# Patient Record
Sex: Female | Born: 1960 | Race: White | Hispanic: No | Marital: Married | Smoking: Never smoker
Health system: Southern US, Community
[De-identification: ages and names within clinical notes are randomized; demographics above are authoritative.]

## PROBLEM LIST (undated history)

## (undated) DIAGNOSIS — C801 Malignant (primary) neoplasm, unspecified: Secondary | ICD-10-CM

## (undated) DIAGNOSIS — Z923 Personal history of irradiation: Secondary | ICD-10-CM

## (undated) DIAGNOSIS — F32A Depression, unspecified: Secondary | ICD-10-CM

## (undated) DIAGNOSIS — E039 Hypothyroidism, unspecified: Secondary | ICD-10-CM

## (undated) DIAGNOSIS — F419 Anxiety disorder, unspecified: Secondary | ICD-10-CM

## (undated) DIAGNOSIS — T4145XA Adverse effect of unspecified anesthetic, initial encounter: Secondary | ICD-10-CM

## (undated) DIAGNOSIS — R112 Nausea with vomiting, unspecified: Secondary | ICD-10-CM

## (undated) DIAGNOSIS — T8859XA Other complications of anesthesia, initial encounter: Secondary | ICD-10-CM

## (undated) DIAGNOSIS — Z9889 Other specified postprocedural states: Secondary | ICD-10-CM

## (undated) DIAGNOSIS — F329 Major depressive disorder, single episode, unspecified: Secondary | ICD-10-CM

## (undated) HISTORY — PX: BACK SURGERY: SHX140

## (undated) HISTORY — DX: Hypothyroidism, unspecified: E03.9

## (undated) HISTORY — PX: CARPAL TUNNEL RELEASE: SHX101

## (undated) HISTORY — PX: ELBOW SURGERY: SHX618

## (undated) SURGERY — Surgical Case
Anesthesia: *Unknown

---
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
## 2004-08-12 ENCOUNTER — Ambulatory Visit: Payer: Self-pay | Admitting: Unknown Physician Specialty

## 2004-08-25 ENCOUNTER — Ambulatory Visit: Payer: Self-pay | Admitting: Unknown Physician Specialty

## 2004-09-29 ENCOUNTER — Ambulatory Visit (HOSPITAL_COMMUNITY): Admission: RE | Admit: 2004-09-29 | Discharge: 2004-09-30 | Payer: Self-pay | Admitting: Neurological Surgery

## 2004-11-02 ENCOUNTER — Encounter: Admission: RE | Admit: 2004-11-02 | Discharge: 2004-11-02 | Payer: Self-pay | Admitting: Neurological Surgery

## 2004-12-27 ENCOUNTER — Encounter: Admission: RE | Admit: 2004-12-27 | Discharge: 2004-12-27 | Payer: Self-pay | Admitting: Neurological Surgery

## 2005-02-28 ENCOUNTER — Ambulatory Visit: Payer: Self-pay | Admitting: Unknown Physician Specialty

## 2005-09-28 ENCOUNTER — Ambulatory Visit: Payer: Self-pay | Admitting: Unknown Physician Specialty

## 2005-10-18 ENCOUNTER — Ambulatory Visit: Payer: Self-pay | Admitting: Unknown Physician Specialty

## 2006-10-19 ENCOUNTER — Ambulatory Visit: Payer: Self-pay | Admitting: Unknown Physician Specialty

## 2007-10-22 ENCOUNTER — Ambulatory Visit: Payer: Self-pay | Admitting: Unknown Physician Specialty

## 2007-11-12 ENCOUNTER — Ambulatory Visit: Payer: Self-pay | Admitting: Unknown Physician Specialty

## 2008-11-17 ENCOUNTER — Ambulatory Visit: Payer: Self-pay | Admitting: Obstetrics

## 2009-12-10 ENCOUNTER — Ambulatory Visit: Payer: Self-pay

## 2009-12-22 ENCOUNTER — Ambulatory Visit: Payer: Self-pay | Admitting: Specialist

## 2009-12-29 ENCOUNTER — Ambulatory Visit: Payer: Self-pay | Admitting: Specialist

## 2010-08-22 ENCOUNTER — Emergency Department: Payer: Self-pay | Admitting: Emergency Medicine

## 2010-09-01 ENCOUNTER — Emergency Department: Payer: Self-pay | Admitting: Emergency Medicine

## 2011-02-08 ENCOUNTER — Ambulatory Visit: Payer: Self-pay | Admitting: Obstetrics

## 2012-05-01 ENCOUNTER — Ambulatory Visit: Payer: Self-pay | Admitting: Obstetrics

## 2013-04-04 HISTORY — PX: COLONOSCOPY: SHX174

## 2013-05-02 ENCOUNTER — Ambulatory Visit: Payer: Self-pay | Admitting: Obstetrics

## 2013-08-05 ENCOUNTER — Ambulatory Visit: Payer: Self-pay | Admitting: Gastroenterology

## 2013-10-11 DIAGNOSIS — E039 Hypothyroidism, unspecified: Secondary | ICD-10-CM | POA: Insufficient documentation

## 2013-10-11 DIAGNOSIS — F419 Anxiety disorder, unspecified: Secondary | ICD-10-CM | POA: Insufficient documentation

## 2014-05-05 ENCOUNTER — Ambulatory Visit: Payer: Self-pay | Admitting: Obstetrics

## 2015-08-03 ENCOUNTER — Other Ambulatory Visit: Payer: Self-pay | Admitting: Obstetrics

## 2015-08-03 DIAGNOSIS — Z1231 Encounter for screening mammogram for malignant neoplasm of breast: Secondary | ICD-10-CM

## 2015-08-11 ENCOUNTER — Ambulatory Visit
Admission: RE | Admit: 2015-08-11 | Discharge: 2015-08-11 | Disposition: A | Discharge status: Home / Self Care | Payer: 59 | Source: Ambulatory Visit | Attending: Obstetrics | Admitting: Obstetrics

## 2015-08-11 DIAGNOSIS — Z1231 Encounter for screening mammogram for malignant neoplasm of breast: Secondary | ICD-10-CM | POA: Diagnosis not present

## 2015-11-19 DIAGNOSIS — M7520 Bicipital tendinitis, unspecified shoulder: Secondary | ICD-10-CM | POA: Insufficient documentation

## 2015-12-11 ENCOUNTER — Other Ambulatory Visit: Payer: Self-pay | Admitting: Student

## 2015-12-11 DIAGNOSIS — Z8249 Family history of ischemic heart disease and other diseases of the circulatory system: Secondary | ICD-10-CM

## 2015-12-28 DIAGNOSIS — Z8249 Family history of ischemic heart disease and other diseases of the circulatory system: Secondary | ICD-10-CM | POA: Insufficient documentation

## 2015-12-29 DIAGNOSIS — R040 Epistaxis: Secondary | ICD-10-CM | POA: Insufficient documentation

## 2015-12-29 DIAGNOSIS — R519 Headache, unspecified: Secondary | ICD-10-CM | POA: Insufficient documentation

## 2016-01-08 ENCOUNTER — Other Ambulatory Visit: Payer: 59

## 2016-04-04 DIAGNOSIS — Z923 Personal history of irradiation: Secondary | ICD-10-CM

## 2016-04-04 HISTORY — DX: Personal history of irradiation: Z92.3

## 2016-05-09 DIAGNOSIS — E039 Hypothyroidism, unspecified: Secondary | ICD-10-CM | POA: Diagnosis not present

## 2016-05-10 DIAGNOSIS — E039 Hypothyroidism, unspecified: Secondary | ICD-10-CM | POA: Diagnosis not present

## 2016-06-02 DIAGNOSIS — J988 Other specified respiratory disorders: Secondary | ICD-10-CM | POA: Diagnosis not present

## 2016-07-07 ENCOUNTER — Other Ambulatory Visit: Payer: Self-pay | Admitting: Obstetrics

## 2016-07-12 DIAGNOSIS — R891 Abnormal level of hormones in specimens from other organs, systems and tissues: Secondary | ICD-10-CM | POA: Diagnosis not present

## 2016-08-05 DIAGNOSIS — K5900 Constipation, unspecified: Secondary | ICD-10-CM | POA: Diagnosis not present

## 2016-08-05 DIAGNOSIS — Z01419 Encounter for gynecological examination (general) (routine) without abnormal findings: Secondary | ICD-10-CM | POA: Diagnosis not present

## 2016-08-05 DIAGNOSIS — N951 Menopausal and female climacteric states: Secondary | ICD-10-CM | POA: Diagnosis not present

## 2016-08-16 ENCOUNTER — Other Ambulatory Visit: Payer: Self-pay | Admitting: Obstetrics

## 2016-08-16 DIAGNOSIS — Z1231 Encounter for screening mammogram for malignant neoplasm of breast: Secondary | ICD-10-CM

## 2016-08-22 DIAGNOSIS — H40003 Preglaucoma, unspecified, bilateral: Secondary | ICD-10-CM | POA: Diagnosis not present

## 2016-09-05 ENCOUNTER — Ambulatory Visit
Admission: RE | Admit: 2016-09-05 | Discharge: 2016-09-05 | Disposition: A | Discharge status: Home / Self Care | Payer: 59 | Source: Ambulatory Visit | Attending: Obstetrics | Admitting: Obstetrics

## 2016-09-05 DIAGNOSIS — Z1231 Encounter for screening mammogram for malignant neoplasm of breast: Secondary | ICD-10-CM | POA: Diagnosis present

## 2016-09-05 DIAGNOSIS — R921 Mammographic calcification found on diagnostic imaging of breast: Secondary | ICD-10-CM | POA: Insufficient documentation

## 2016-09-07 ENCOUNTER — Other Ambulatory Visit: Payer: Self-pay | Admitting: Obstetrics

## 2016-09-07 DIAGNOSIS — R921 Mammographic calcification found on diagnostic imaging of breast: Secondary | ICD-10-CM

## 2016-09-07 DIAGNOSIS — R928 Other abnormal and inconclusive findings on diagnostic imaging of breast: Secondary | ICD-10-CM

## 2016-09-12 ENCOUNTER — Ambulatory Visit
Admission: RE | Admit: 2016-09-12 | Discharge: 2016-09-12 | Disposition: A | Discharge status: Home / Self Care | Payer: 59 | Source: Ambulatory Visit | Attending: Obstetrics | Admitting: Obstetrics

## 2016-09-12 DIAGNOSIS — R921 Mammographic calcification found on diagnostic imaging of breast: Secondary | ICD-10-CM

## 2016-09-12 DIAGNOSIS — R928 Other abnormal and inconclusive findings on diagnostic imaging of breast: Secondary | ICD-10-CM

## 2016-09-12 DIAGNOSIS — R922 Inconclusive mammogram: Secondary | ICD-10-CM | POA: Diagnosis not present

## 2016-09-15 ENCOUNTER — Other Ambulatory Visit: Payer: Self-pay | Admitting: Obstetrics

## 2016-09-15 DIAGNOSIS — R921 Mammographic calcification found on diagnostic imaging of breast: Secondary | ICD-10-CM | POA: Diagnosis not present

## 2016-09-15 DIAGNOSIS — R928 Other abnormal and inconclusive findings on diagnostic imaging of breast: Secondary | ICD-10-CM

## 2016-09-19 ENCOUNTER — Ambulatory Visit
Admission: RE | Admit: 2016-09-19 | Discharge: 2016-09-19 | Disposition: A | Discharge status: Home / Self Care | Payer: 59 | Source: Ambulatory Visit | Attending: Obstetrics | Admitting: Obstetrics

## 2016-09-19 DIAGNOSIS — R921 Mammographic calcification found on diagnostic imaging of breast: Secondary | ICD-10-CM | POA: Insufficient documentation

## 2016-09-19 DIAGNOSIS — R928 Other abnormal and inconclusive findings on diagnostic imaging of breast: Secondary | ICD-10-CM

## 2016-09-19 DIAGNOSIS — C50311 Malignant neoplasm of lower-inner quadrant of right female breast: Secondary | ICD-10-CM | POA: Diagnosis not present

## 2016-09-19 DIAGNOSIS — D0511 Intraductal carcinoma in situ of right breast: Secondary | ICD-10-CM | POA: Diagnosis not present

## 2016-09-19 HISTORY — PX: BREAST BIOPSY: SHX20

## 2016-09-22 ENCOUNTER — Other Ambulatory Visit: Payer: Self-pay

## 2016-09-22 NOTE — Progress Notes (Signed)
  Oncology Nurse Navigator Documentation  Navigator Location: [facility] (09/22/16 1300)   )Navigator Encounter Type: Introductory phone call (09/22/16 1300)   Abnormal Finding Date: 09/12/16 (09/22/16 1300) Confirmed Diagnosis Date: 09/19/16 (09/22/16 1300)                   Barriers/Navigation Needs: Education;Coordination of Care (09/22/16 1300)   Interventions: Coordination of Care (09/22/16 1300)   Coordination of Care: Appts (09/22/16 1300)                  Time Spent with Patient: 60 (09/22/16 1300)   Scheduled patient with MedOnc, and Surgery.  Introduced IT trainer

## 2016-09-23 LAB — SURGICAL PATHOLOGY

## 2016-09-26 DIAGNOSIS — C50311 Malignant neoplasm of lower-inner quadrant of right female breast: Secondary | ICD-10-CM | POA: Diagnosis not present

## 2016-09-26 DIAGNOSIS — Z170 Estrogen receptor positive status [ER+]: Secondary | ICD-10-CM | POA: Diagnosis not present

## 2016-09-27 ENCOUNTER — Other Ambulatory Visit: Payer: Self-pay | Admitting: Surgery

## 2016-09-27 DIAGNOSIS — Z170 Estrogen receptor positive status [ER+]: Principal | ICD-10-CM

## 2016-09-27 DIAGNOSIS — C50311 Malignant neoplasm of lower-inner quadrant of right female breast: Secondary | ICD-10-CM

## 2016-09-27 NOTE — Progress Notes (Signed)
  Oncology Nurse Navigator Documentation  Navigator Location: [facility] (09/27/16 1500)   )Navigator Encounter Type: Introductory phone call;Education;Telephone (09/27/16 1500) Telephone: Lahoma Crocker Call;Appt Confirmation/Clarification;Education (09/27/16 1500)                       Barriers/Navigation Needs: Coordination of Care;Education (09/27/16 1500) Education: Understanding Cancer/ Treatment Options;Newly Diagnosed Cancer Education;Concerns with Insurance Coverage;Preparing for Upcoming Surgery/ Treatment;Coping with Diagnosis/ Prognosis (09/27/16 1500) Interventions: Coordination of Care;Education (Counseling service;[employer]) (09/27/16 1500)   Coordination of Care: Appts (09/27/16 1500) Education Method: Written;Verbal;Teach-back (09/27/16 1500)                Time Spent with Patient: 90 (09/27/16 1500)  Met patient ,and husband in Dr. Thompson Caul office yesterday. Gave Breast Cancer treatment Handbook/folder with hospital services.  Spoke by phone today.  Anxious.  Discussed family support, counseling service, assistance with FMLA/insurance. To bring [facility] paperwork Friday to give to [facility] for assistance filling out.  Confirmed appointment information for Dr. Janese Banks on 09/30/16 at 8:30.

## 2016-09-28 ENCOUNTER — Other Ambulatory Visit: Payer: Self-pay | Admitting: Surgery

## 2016-09-28 DIAGNOSIS — Z170 Estrogen receptor positive status [ER+]: Principal | ICD-10-CM

## 2016-09-28 DIAGNOSIS — C50311 Malignant neoplasm of lower-inner quadrant of right female breast: Secondary | ICD-10-CM

## 2016-09-28 DIAGNOSIS — G479 Sleep disorder, unspecified: Secondary | ICD-10-CM | POA: Diagnosis not present

## 2016-09-29 DIAGNOSIS — M7542 Impingement syndrome of left shoulder: Secondary | ICD-10-CM | POA: Insufficient documentation

## 2016-09-30 ENCOUNTER — Inpatient Hospital Stay: Payer: 59 | Attending: Oncology | Admitting: Oncology

## 2016-09-30 ENCOUNTER — Encounter: Payer: Self-pay | Admitting: Oncology

## 2016-09-30 ENCOUNTER — Encounter: Payer: Self-pay | Admitting: *Deleted

## 2016-09-30 DIAGNOSIS — E039 Hypothyroidism, unspecified: Secondary | ICD-10-CM | POA: Insufficient documentation

## 2016-09-30 DIAGNOSIS — C50311 Malignant neoplasm of lower-inner quadrant of right female breast: Secondary | ICD-10-CM

## 2016-09-30 DIAGNOSIS — Z170 Estrogen receptor positive status [ER+]: Secondary | ICD-10-CM | POA: Diagnosis not present

## 2016-09-30 DIAGNOSIS — M25512 Pain in left shoulder: Secondary | ICD-10-CM

## 2016-09-30 DIAGNOSIS — C50919 Malignant neoplasm of unspecified site of unspecified female breast: Secondary | ICD-10-CM | POA: Diagnosis not present

## 2016-09-30 DIAGNOSIS — F419 Anxiety disorder, unspecified: Secondary | ICD-10-CM | POA: Insufficient documentation

## 2016-09-30 DIAGNOSIS — Z79899 Other long term (current) drug therapy: Secondary | ICD-10-CM

## 2016-09-30 NOTE — Progress Notes (Signed)
  Oncology Nurse Navigator Documentation  Navigator Location: [facility] (09/30/16 1000)   )Navigator Encounter Type: Initial MedOnc (09/30/16 1000)         Genetic Counseling Date: 09/30/16 (09/30/16 1000)           Patient Visit Type: MedOnc (09/30/16 1000) Treatment Phase: Pre-Tx/Tx Discussion (09/30/16 1000)                            Time Spent with Patient: 90 (09/30/16 1000)   Met patient and her husband during her initial medical oncology consult with Dr. Janese Banks.  Patient is very anxious and states she is not sleeping or eating.  States her gyn has increased her Wellbutrin and given her some Ambien for sleep.  Dr. Janese Banks discussed possibility of mammoprint testing based on the size of the cancer on final pathology.  Patient wants genetic testing.  Her maternal grandmother had breast cancer, a maternal uncle with pancreatic cancer and her dad with lung cancer.  She works for [employer] and wants all her labs completed through them. Elliot Gault is looking into her insurance to question pre-authorization for genetic testing.  Will let patient know what I find out.  Patient is to call if she has any questions or needs.

## 2016-09-30 NOTE — Progress Notes (Signed)
Hematology/Oncology Consult note [facility] Telephone:([phone] [phone]  Patient Care Team: Juluis Pitch, MD as PCP - General (Family Medicine)   Name of the patient: Sandy Petty  [id number]  [birth year]    Reason for referral- new right breast cancer   Referring physician- Dr. Pamala Hurry  Date of visit: 09/30/16   History of presenting illness- 1. Patient is a 56 year old female who recently had a normal bilateral screening mammogram on 09/05/2016 which showed calcifications in her right breast warranting further evaluation. This was followed by a diagnostic right breast mammogram which showed 5 x 3 x 2 mm or calcification in the right breast lower inner quadrant. No associated mass.  2. She had stereotactic biopsy of this lesion which showed invasive mammary carcinomato type. 3 mm, grade 2 ER greater than 90% positive, PR 1-10% positive and HER-2/neu negative   3. Patient has been seen by Dr. Tamala Julian and is scheduled to undergo surgery on 10/11/16  4. Patient has been getting mammograms since the age of 37 and has not had any prior abnormal mammograms. She is G1 P1 L1. She used birth control pills for a long time and then switched to IUD which she has had for 10 years and recently had taken out. She has not had any menstrual cycles since the insertion of IUD. Family history significant for breast cancer in her maternal grandmother died in her 35s and pancreatic cancer in her maternal uncle.  5. Currently patient reports doing well she does have ongoing problems with left shoulder pain and has been seeing orthopedics for the same. Her appetite is good and she denies any unintentional weight los. She is nervous in anticipation of her surgery at today's visit   ECOG PS- 0  Pain scale- 0   Review of systems- Review of Systems  Constitutional: Negative for chills, fever, malaise/fatigue and weight loss.  HENT: Negative for congestion, ear  discharge and nosebleeds.   Eyes: Negative for blurred vision.  Respiratory: Negative for cough, hemoptysis, sputum production, shortness of breath and wheezing.   Cardiovascular: Negative for chest pain, palpitations, orthopnea and claudication.  Gastrointestinal: Negative for abdominal pain, blood in stool, constipation, diarrhea, heartburn, melena, nausea and vomiting.  Genitourinary: Negative for dysuria, flank pain, frequency, hematuria and urgency.  Musculoskeletal: Negative for back pain, joint pain and myalgias.  Skin: Negative for rash.  Neurological: Negative for dizziness, tingling, focal weakness, seizures, weakness and headaches.  Endo/Heme/Allergies: Does not bruise/bleed easily.  Psychiatric/Behavioral: Negative for depression and suicidal ideas. The patient does not have insomnia.     Allergies  Allergen Reactions  . Meloxicam Rash  . Sulfa Antibiotics Rash    Patient Active Problem List   Diagnosis Date Noted  . Breast cancer ([place]) 09/30/2016  . Biceps tendinitis 11/19/2015  . Anxiety 10/11/2013  . Hypothyroidism, unspecified 10/11/2013     Past Medical History:  Diagnosis Date  . Hypothyroidism      Past Surgical History:  Procedure Laterality Date  . BREAST BIOPSY Right 09/19/2016   stereotactic biopsy  . COLONOSCOPY  2015    Social History   Social History  . Marital status: Married    Spouse name: N/A  . Number of children: N/A  . Years of education: N/A   Occupational History  . Not on file.   Social History Main Topics  . Smoking status: Never Smoker  . Smokeless tobacco: Never Used  . Alcohol use Yes     Comment: Social  .  Drug use: No  . Sexual activity: Yes   Other Topics Concern  . Not on file   Social History Narrative  . No narrative on file     Family History  Problem Relation Age of Onset  . Breast cancer Maternal Grandmother 40     Current Outpatient Prescriptions:  .  levothyroxine (SYNTHROID, LEVOTHROID) 125  MCG tablet, Take 125 mcg by mouth daily before breakfast., Disp: , Rfl:  .  buPROPion (WELLBUTRIN SR) 200 MG 12 hr tablet, Take 200 mg by mouth daily., Disp: , Rfl:  .  ibuprofen (ADVIL,MOTRIN) 200 MG tablet, Take 600 mg by mouth every 8 (eight) hours as needed (for pain.)., Disp: , Rfl:  .  zolpidem (AMBIEN) 5 MG tablet, Take 5 mg by mouth as needed., Disp: , Rfl:    Physical exam:  Vitals:   09/30/16 1151  BP: (!) 145/75  Pulse: 76  Resp: 20  Temp: 98.3 F (36.8 C)  Weight: 160 lb 7 oz (72.8 kg)  Height: '5\' 5"'  (1.651 m)   Physical Exam  Constitutional: She is oriented to person, place, and time and well-developed, well-nourished, and in no distress.  HENT:  Head: Normocephalic and atraumatic.  Eyes: EOM are normal. Pupils are equal, round, and reactive to light.  Neck: Normal range of motion.  Cardiovascular: Normal rate, regular rhythm and normal heart sounds.   Pulmonary/Chest: Effort normal and breath sounds normal.  Abdominal: Soft. Bowel sounds are normal.  Neurological: She is alert and oriented to person, place, and time.  Skin: Skin is warm and dry.   no palpable masses in either breast. No palpable axillary adenopathy. Bruising noted over inner lower quadrant a the site of biopsy      Mm Digital Diagnostic Unilat R  Result Date: 09/12/2016 CLINICAL DATA:  Right breast calcifications seen on most recent screening mammography. EXAM: DIGITAL DIAGNOSTIC RIGHT MAMMOGRAM WITH CAD COMPARISON:  Previous exam(s). ACR Breast Density Category c: The breast tissue is heterogeneously dense, which may obscure small masses. FINDINGS: Additional mammographic views of the right breast demonstrate a group of indeterminate calcifications in the right breast lower slightly inner quadrant measuring 5 x 3 x 2 mm. There is no associated mass. Mammographic images were processed with CAD. IMPRESSION: 5 mm group of indeterminate right breast lower slightly inner quadrant calcifications, for  which stereotactic core needle biopsy is recommended. RECOMMENDATION: Stereotactic core needle biopsy of right breast. I have discussed the findings and recommendations with the patient. Results were also provided in writing at the conclusion of the visit. If applicable, a reminder letter will be sent to the patient regarding the next appointment. BI-RADS CATEGORY  4: Suspicious. Electronically Signed   By: Fidela Salisbury M.D.   On: 09/12/2016 16:10   Mm Digital Screening Bilateral  Result Date: 09/05/2016 CLINICAL DATA:  Screening. EXAM: DIGITAL SCREENING BILATERAL MAMMOGRAM WITH CAD COMPARISON:  Previous exam(s). ACR Breast Density Category b: There are scattered areas of fibroglandular density. FINDINGS: In the right breast, calcifications warrant further evaluation with magnified views. In the left breast, no findings suspicious for malignancy. Images were processed with CAD. IMPRESSION: Further evaluation is suggested for calcifications in the right breast. RECOMMENDATION: Diagnostic mammogram of the right breast. (Code:[id number]) The patient will be contacted regarding the findings, and additional imaging will be scheduled. BI-RADS CATEGORY  0: Incomplete. Need additional imaging evaluation and/or prior mammograms for comparison. Electronically Signed   By: Lillia Mountain M.D.   On: 09/05/2016 15:22   Mm  Clip Placement Right  Result Date: 09/19/2016 CLINICAL DATA:  Status post stereotactic biopsy today for right breast calcifications. EXAM: DIAGNOSTIC RIGHT MAMMOGRAM POST STEREOTACTIC BIOPSY COMPARISON:  Previous exam(s). FINDINGS: Mammographic images were obtained following stereotactic guided biopsy of calcifications within the lower inner quadrant of the right breast. At the conclusion of the procedure, a top hat shaped tissue marker was placed at the biopsy site. The clip is well-positioned at the site of the targeted calcifications. IMPRESSION: Top hat shaped clip well-positioned at the site of  the targeted calcifications in the lower inner quadrant of the right breast. Final Assessment: Post Procedure Mammograms for Marker Placement Electronically Signed   By: Franki Cabot M.D.   On: 09/19/2016 13:55   Mm Rt Breast Bx W Loc Dev 1st Lesion Image Bx Spec Stereo Guide  Addendum Date: 09/23/2016   ADDENDUM REPORT: 09/23/2016 08:47 ADDENDUM: Pathology of the right breast biopsy revealed A. BREAST CALCIFICATIONS, RIGHT LOWER INNER QUADRANT; STEREOTACTIC BIOPSY: INVASIVE MAMMARY CARCINOMA, NO SPECIAL TYPE. Size of invasive carcinoma: 3 mm in this sample. Histologic grade of invasive carcinoma: 2. Ductal carcinoma in situ: Present, intermediate grade with associated calcifications and comedo necrosis. Lymphovascular invasion: Not identified. ER/PR/HER2: Immunohistochemistry will be performed on block A2, with reflex to [place] for HER2 2+. The results will be reported in an addendum. Comment: The definitive grade will be assigned on the excisional specimen. These findings were communicated to [place] in Dr.Maynard's office on 09/20/2016. Read back procedure was performed. This was found to be concordant with Dr. Reynolds Bowl impression and notes. Recommendation:  Surgical and oncology referrals. At the patient's request, results and recommendations were relayed to the patient by phone by Dr. Brigitte Pulse on 09/21/16. The patient stated she has done well following the biopsy. All of her questions were answered. She was encouraged to call the [facility] with any further questions or concerns. Results were relayed to Dr. Jerrilyn Cairo nurse on 09/21/16 by Jetta Lout, [place]. Results and request for referral were relayed to Al Pimple, RN, nurse navigator for [facility]. Appointments were made with Dr. Tamala Julian, surgeon for 09/26/16 and Dr. Janese Banks, oncologist for 09/30/16 by Al Pimple, RN. The patient has been notified of the appointment information. Addendum by Jetta Lout, RRA on 09/23/16.  Electronically Signed   By: Franki Cabot M.D.   On: 09/23/2016 08:47   Result Date: 09/23/2016 CLINICAL DATA:  Patient with right breast calcifications presents today for stereotactic biopsy. EXAM: RIGHT BREAST STEREOTACTIC CORE NEEDLE BIOPSY COMPARISON:  Previous exams. FINDINGS: The patient and I discussed the procedure of stereotactic-guided biopsy including benefits and alternatives. We discussed the high likelihood of a successful procedure. We discussed the risks of the procedure including infection, bleeding, tissue injury, clip migration, and inadequate sampling. Informed written consent was given. The usual time out protocol was performed immediately prior to the procedure. Using sterile technique and 1% Lidocaine as local anesthetic, under stereotactic guidance, a 9 gauge vacuum assisted device was used to perform core needle biopsy of calcifications in the lower inner quadrant of the right breast using a medial approach. Specimen radiograph was performed showing calcifications. Specimens with calcifications are identified for pathology. Lesion quadrant: Lower inner quadrant At the conclusion of the procedure, a top hat shaped tissue marker clip was deployed into the biopsy cavity. Follow-up 2-view mammogram was performed and dictated separately. IMPRESSION: Stereotactic-guided biopsy of grouped indeterminate calcifications within the lower inner quadrant of the right breast. No apparent complications. Electronically Signed: By: Cherlynn Kaiser  Enriqueta Shutter M.D. On: 09/19/2016 13:41    Assessment and plan- Patient is a 56 y.o. 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 Given the small size of tumor that was strongly ER/PR positive there would be no role for neoadjuvant chemotherapy. Patient will proceed for definitive lumpectomy and sentinel  lymph node biopsy at this time. If her final size of tumor is less than 5 mm she would not require mammaprint testing to determine if she would benefit from chemotherapy. If her tumor sizes more than 5 mm then I would consider mammaprint testing for her. I discussed what mammaprint testing is all out and how the results are interpreted. She falls into the low risk group she will not require chemotherapy. Chemotherapy would be indicated if she were to be in the high risk group which I doubt based on her baseline tumor characteristics.  Will discuss hormone therapy after her surgery as she is overwhelmed at this time. She understands that her tumor is ER PR positive and she will need hormone therapy after surgery and radiation  Will check hormone levels- LH, FSH and estradiol to determine her menopausal status which will help US guide hormone therapy. Check baseline cbc, cmp today  Given family history as above- I will see if she qualifies for genetic testing  I will see her back 1 week post surgery  Thank you for this kind referral and the opportunity to participate in the care of this patient   Visit Diagnosis 1. Malignant neoplasm of lower-inner quadrant of right breast of female, estrogen receptor positive ([place])     Dr. Randa Evens, MD, MPH [facility] Pager- [phone] 09/30/2016  11:52 AM

## 2016-09-30 NOTE — Progress Notes (Signed)
Patient here today as new evaluation regarding invasive breast cancer. Referred by Dr. Pamala Hurry.

## 2016-10-02 DIAGNOSIS — C801 Malignant (primary) neoplasm, unspecified: Secondary | ICD-10-CM

## 2016-10-02 HISTORY — DX: Malignant (primary) neoplasm, unspecified: C80.1

## 2016-10-04 ENCOUNTER — Encounter
Admission: RE | Admit: 2016-10-04 | Discharge: 2016-10-04 | Disposition: A | Discharge status: Home / Self Care | Payer: 59 | Source: Ambulatory Visit | Attending: Surgery | Admitting: Surgery

## 2016-10-04 ENCOUNTER — Telehealth: Payer: Self-pay | Admitting: *Deleted

## 2016-10-04 HISTORY — DX: Anxiety disorder, unspecified: F41.9

## 2016-10-04 HISTORY — DX: Malignant (primary) neoplasm, unspecified: C80.1

## 2016-10-04 NOTE — Telephone Encounter (Signed)
Inquiring of the BRCA testing needs to be run stat. Per PC from patient , results are needed before her surgery which is scheduled for 7/10. Per Stanton Kidney, even if ordered stat, there is a 10 day turn around time an d results will not be available by surgery date. Please advise.  Per VO Dr Janese Banks there is no need to order stat if the results will not be back by surgery, we are to contact Dr Darliss Ridgel office and let him know to postpone surgery until BRCA testing is done.  I called Dr Darliss Ridgel office, but they are closed, will retry on 10/06/16

## 2016-10-04 NOTE — Telephone Encounter (Signed)
[place] to determine exactly what they are needing.  Was informed when unable to reach [place] they contacted the patient and obtained the information they needed.

## 2016-10-04 NOTE — Telephone Encounter (Signed)
Can you call and tell them family history mentioned in my note?

## 2016-10-04 NOTE — Telephone Encounter (Addendum)
[employer] called and needs additional history on patient; personal and family

## 2016-10-04 NOTE — Patient Instructions (Signed)
  Your procedure is scheduled on: 10-11-16 Report to [place] @ 8:15 AM  Remember: Instructions that are not followed completely may result in serious medical risk, up to and including death, or upon the discretion of your surgeon and anesthesiologist your surgery may need to be rescheduled.    _x___ 1. Do not eat food or drink liquids after midnight. No gum chewing or hard candies.     __x__ 2. No Alcohol for 24 hours before or after surgery.   __x__3. No Smoking for 24 prior to surgery.   ____  4. Bring all medications with you on the day of surgery if instructed.    __x__ 5. Notify your doctor if there is any change in your medical condition     (cold, fever, infections).     Do not wear jewelry, make-up, hairpins, clips or nail polish.  Do not wear lotions, powders, or perfumes. You may wear deodorant.  Do not shave 48 hours prior to surgery. Men may shave face and neck.  Do not bring valuables to the hospital.    [facility] is not responsible for any belongings or valuables.               Contacts, dentures or bridgework may not be worn into surgery.  Leave your suitcase in the car. After surgery it may be brought to your room.  For patients admitted to the hospital, discharge time is determined by your  treatment team.   Patients discharged the day of surgery will not be allowed to drive home.  You will need someone to drive you home and stay with you the night of your procedure.    Please read over the following fact sheets that you were given:     _x___ Take anti-hypertensive (unless it includes a diuretic), cardiac, seizure, asthma,     anti-reflux and psychiatric medicines. These include:  1. EFFEXOR  2. LEVOTHYROXINE  3.  4.  5.  6.  ____Fleets enema or Magnesium Citrate as directed.   ____ Use CHG Soap or sage wipes as directed on instruction sheet   ____ Use inhalers on the day of surgery and bring to hospital day of surgery  ____ Stop Metformin  and Janumet 2 days prior to surgery.    ____ Take 1/2 of usual insulin dose the night before surgery and none on the morning surgery.   ____ Follow recommendations from Cardiologist, Pulmonologist or PCP regarding  stopping Aspirin, Coumadin, Pllavix ,Eliquis, Effient, or Pradaxa, and Pletal.  X____Stop Anti-inflammatories such as Advil, Aleve, IBUPROFEN, Motrin, Naproxen, Naprosyn, Goodies powders or aspirin products NOW-OK to take Tylenol    ____ Stop supplements until after surgery   ____ Bring C-Pap to the hospital.

## 2016-10-04 NOTE — Telephone Encounter (Signed)
[employer] verification called asking for personal and family history on this patient.  Phone # [phone].

## 2016-10-05 NOTE — Telephone Encounter (Signed)
Hassan Rowan- Please let Dr. Darliss Ridgel office know that results will not be back until 10th even if ordered stat. Decision to go ahead with lumpectomy or wait for results and postpone surgery would be upto them. If they plan to postpone surgery, we can order it stat. Thanks, Astrid Divine

## 2016-10-06 NOTE — Telephone Encounter (Signed)
I called Dr Darliss Ridgel office and she is going t have Dr Tamala Julian Call Dr Janese Banks when he gets out of surgery, I also forwarded this message to his inbasket

## 2016-10-11 ENCOUNTER — Ambulatory Visit
Admission: RE | Admit: 2016-10-11 | Discharge: 2016-10-11 | Disposition: A | Discharge status: Home / Self Care | Payer: 59 | Source: Ambulatory Visit | Attending: Surgery | Admitting: Surgery

## 2016-10-11 ENCOUNTER — Encounter: Payer: Self-pay | Admitting: *Deleted

## 2016-10-11 ENCOUNTER — Ambulatory Visit: Payer: 59 | Admitting: Anesthesiology

## 2016-10-11 ENCOUNTER — Encounter
Admission: RE | Disposition: A | Discharge status: Home / Self Care | Payer: Self-pay | Source: Ambulatory Visit | Attending: Surgery

## 2016-10-11 ENCOUNTER — Encounter
Admission: RE | Admit: 2016-10-11 | Discharge: 2016-10-11 | Disposition: A | Discharge status: Home / Self Care | Payer: 59 | Source: Ambulatory Visit | Attending: Surgery | Admitting: Surgery

## 2016-10-11 DIAGNOSIS — Z170 Estrogen receptor positive status [ER+]: Secondary | ICD-10-CM | POA: Insufficient documentation

## 2016-10-11 DIAGNOSIS — Z803 Family history of malignant neoplasm of breast: Secondary | ICD-10-CM | POA: Insufficient documentation

## 2016-10-11 DIAGNOSIS — C50311 Malignant neoplasm of lower-inner quadrant of right female breast: Secondary | ICD-10-CM | POA: Insufficient documentation

## 2016-10-11 DIAGNOSIS — C50919 Malignant neoplasm of unspecified site of unspecified female breast: Secondary | ICD-10-CM | POA: Diagnosis not present

## 2016-10-11 DIAGNOSIS — C50811 Malignant neoplasm of overlapping sites of right female breast: Secondary | ICD-10-CM | POA: Diagnosis not present

## 2016-10-11 DIAGNOSIS — N63 Unspecified lump in unspecified breast: Secondary | ICD-10-CM | POA: Diagnosis present

## 2016-10-11 DIAGNOSIS — E039 Hypothyroidism, unspecified: Secondary | ICD-10-CM | POA: Insufficient documentation

## 2016-10-11 DIAGNOSIS — C50911 Malignant neoplasm of unspecified site of right female breast: Secondary | ICD-10-CM | POA: Diagnosis not present

## 2016-10-11 HISTORY — PX: BREAST EXCISIONAL BIOPSY: SUR124

## 2016-10-11 HISTORY — PX: BREAST LUMPECTOMY: SHX2

## 2016-10-11 HISTORY — PX: PARTIAL MASTECTOMY WITH NEEDLE LOCALIZATION: SHX6008

## 2016-10-11 HISTORY — PX: SENTINEL NODE BIOPSY: SHX6608

## 2016-10-11 LAB — POCT PREGNANCY, URINE: PREG TEST UR: NEGATIVE

## 2016-10-11 SURGERY — PARTIAL MASTECTOMY WITH NEEDLE LOCALIZATION
Anesthesia: General | Laterality: Right | Wound class: Clean

## 2016-10-11 MED ORDER — BUPIVACAINE-EPINEPHRINE (PF) 0.5% -1:200000 IJ SOLN
INTRAMUSCULAR | Status: AC
Start: 2016-10-11 — End: ?
  Filled 2016-10-11: qty 30

## 2016-10-11 MED ORDER — TRAMADOL HCL 50 MG PO TABS
ORAL_TABLET | ORAL | Status: AC
  Filled 2016-10-11: qty 1

## 2016-10-11 MED ORDER — TRAMADOL HCL 50 MG PO TABS
50.0000 mg | ORAL_TABLET | ORAL | Status: DC | PRN
  Administered 2016-10-11: 50 mg via ORAL

## 2016-10-11 MED ORDER — FAMOTIDINE 20 MG PO TABS
20.0000 mg | ORAL_TABLET | Freq: Once | ORAL | Status: AC
  Administered 2016-10-11: 20 mg via ORAL

## 2016-10-11 MED ORDER — FAMOTIDINE 20 MG PO TABS
ORAL_TABLET | ORAL | Status: AC
  Filled 2016-10-11: qty 1

## 2016-10-11 MED ORDER — PROPOFOL 10 MG/ML IV BOLUS
INTRAVENOUS | Status: DC | PRN
  Administered 2016-10-11: 150 mg via INTRAVENOUS

## 2016-10-11 MED ORDER — LACTATED RINGERS IV SOLN
INTRAVENOUS | Status: DC
  Administered 2016-10-11 (×3): via INTRAVENOUS

## 2016-10-11 MED ORDER — TECHNETIUM TC 99M SULFUR COLLOID FILTERED
0.8900 | Freq: Once | INTRAVENOUS | Status: AC | PRN
  Administered 2016-10-11: 0.89 via INTRADERMAL

## 2016-10-11 MED ORDER — EPHEDRINE SULFATE 50 MG/ML IJ SOLN
INTRAMUSCULAR | Status: DC | PRN
  Administered 2016-10-11: 10 mg via INTRAVENOUS

## 2016-10-11 MED ORDER — BUPIVACAINE-EPINEPHRINE 0.5% -1:200000 IJ SOLN
INTRAMUSCULAR | Status: DC | PRN
  Administered 2016-10-11: 15 mL

## 2016-10-11 MED ORDER — METOPROLOL TARTRATE 5 MG/5ML IV SOLN
INTRAVENOUS | Status: AC
  Filled 2016-10-11: qty 5

## 2016-10-11 MED ORDER — FENTANYL CITRATE (PF) 100 MCG/2ML IJ SOLN
INTRAMUSCULAR | Status: DC | PRN
  Administered 2016-10-11: 50 ug via INTRAVENOUS

## 2016-10-11 MED ORDER — FENTANYL CITRATE (PF) 100 MCG/2ML IJ SOLN
INTRAMUSCULAR | Status: AC
  Administered 2016-10-11: 25 ug via INTRAVENOUS
  Filled 2016-10-11: qty 2

## 2016-10-11 MED ORDER — HYDROMORPHONE HCL 1 MG/ML IJ SOLN
INTRAMUSCULAR | Status: AC
Start: 2016-10-11 — End: ?
  Filled 2016-10-11: qty 1

## 2016-10-11 MED ORDER — METOPROLOL TARTRATE 5 MG/5ML IV SOLN
3.0000 mg | Freq: Once | INTRAVENOUS | Status: AC
  Administered 2016-10-11: 3 mg via INTRAVENOUS

## 2016-10-11 MED ORDER — ONDANSETRON HCL 4 MG/2ML IJ SOLN
INTRAMUSCULAR | Status: AC
  Filled 2016-10-11: qty 2

## 2016-10-11 MED ORDER — ONDANSETRON HCL 4 MG/2ML IJ SOLN
INTRAMUSCULAR | Status: DC | PRN
  Administered 2016-10-11: 4 mg via INTRAVENOUS

## 2016-10-11 MED ORDER — MIDAZOLAM HCL 2 MG/2ML IJ SOLN
INTRAMUSCULAR | Status: AC
  Filled 2016-10-11: qty 2

## 2016-10-11 MED ORDER — HYDROMORPHONE HCL 1 MG/ML IJ SOLN
INTRAMUSCULAR | Status: DC | PRN
  Administered 2016-10-11 (×2): 0.5 mg via INTRAVENOUS

## 2016-10-11 MED ORDER — PROPOFOL 10 MG/ML IV BOLUS
INTRAVENOUS | Status: AC
  Filled 2016-10-11: qty 20

## 2016-10-11 MED ORDER — TRAMADOL HCL 50 MG PO TABS: 50.0000 mg | ORAL_TABLET | ORAL | 0 refills | Status: DC | PRN

## 2016-10-11 MED ORDER — FENTANYL CITRATE (PF) 100 MCG/2ML IJ SOLN
INTRAMUSCULAR | Status: AC
  Filled 2016-10-11: qty 2

## 2016-10-11 MED ORDER — MIDAZOLAM HCL 2 MG/2ML IJ SOLN
INTRAMUSCULAR | Status: DC | PRN
  Administered 2016-10-11: 2 mg via INTRAVENOUS

## 2016-10-11 MED ORDER — ONDANSETRON HCL 4 MG/2ML IJ SOLN: 4.0000 mg | Freq: Once | INTRAMUSCULAR | Status: DC | PRN

## 2016-10-11 MED ORDER — FENTANYL CITRATE (PF) 100 MCG/2ML IJ SOLN
25.0000 ug | INTRAMUSCULAR | Status: DC | PRN
  Administered 2016-10-11 (×2): 25 ug via INTRAVENOUS

## 2016-10-11 MED ORDER — LIDOCAINE HCL (CARDIAC) 20 MG/ML IV SOLN
INTRAVENOUS | Status: DC | PRN
  Administered 2016-10-11: 100 mg via INTRAVENOUS

## 2016-10-11 MED ORDER — DEXAMETHASONE SODIUM PHOSPHATE 10 MG/ML IJ SOLN
INTRAMUSCULAR | Status: DC | PRN
  Administered 2016-10-11: 5 mg via INTRAVENOUS

## 2016-10-11 SURGICAL SUPPLY — 39 items
ADH SKN CLS APL DERMABOND .7 (GAUZE/BANDAGES/DRESSINGS) ×1
BLADE SURG 15 STRL LF DISP TIS (BLADE) ×1 IMPLANT
BLADE SURG 15 STRL SS (BLADE) ×2
CANISTER SUCT 1200ML W/VALVE (MISCELLANEOUS) ×2 IMPLANT
CHLORAPREP W/TINT 26ML (MISCELLANEOUS) ×2 IMPLANT
CNTNR SPEC 2.5X3XGRAD LEK (MISCELLANEOUS) ×2
CONT SPEC 4OZ STER OR WHT (MISCELLANEOUS) ×2
CONT SPEC 4OZ STRL OR WHT (MISCELLANEOUS) ×2
CONTAINER SPEC 2.5X3XGRAD LEK (MISCELLANEOUS) ×2 IMPLANT
DERMABOND ADVANCED (GAUZE/BANDAGES/DRESSINGS) ×1
DERMABOND ADVANCED .7 DNX12 (GAUZE/BANDAGES/DRESSINGS) ×1 IMPLANT
DEVICE DUBIN SPECIMEN MAMMOGRA (MISCELLANEOUS) ×2 IMPLANT
DRAPE LAPAROTOMY 77X122 PED (DRAPES) ×2 IMPLANT
ELECT REM PT RETURN 9FT ADLT (ELECTROSURGICAL) ×2
ELECTRODE REM PT RTRN 9FT ADLT (ELECTROSURGICAL) ×1 IMPLANT
GLOVE BIO SURGEON STRL SZ7.5 (GLOVE) ×4 IMPLANT
GLOVE BIOGEL PI IND STRL 7.5 (GLOVE) IMPLANT
GLOVE BIOGEL PI INDICATOR 7.5 (GLOVE) ×2
GOWN STRL REUS W/ TWL LRG LVL3 (GOWN DISPOSABLE) ×2 IMPLANT
GOWN STRL REUS W/TWL LRG LVL3 (GOWN DISPOSABLE) ×6
KIT RM TURNOVER STRD PROC AR (KITS) ×2 IMPLANT
LABEL OR SOLS (LABEL) ×2 IMPLANT
MARGIN MAP 10MM (MISCELLANEOUS) ×2 IMPLANT
NDL HYPO 25X1 1.5 SAFETY (NEEDLE) ×1 IMPLANT
NDL SAFETY 18GX1.5 (NEEDLE) ×1 IMPLANT
NDL SAFETY 22GX1.5 (NEEDLE) ×1 IMPLANT
NEEDLE HYPO 25X1 1.5 SAFETY (NEEDLE) ×2 IMPLANT
PACK BASIN MINOR ARMC (MISCELLANEOUS) ×2 IMPLANT
PENCIL ELECTRO HAND CTR (MISCELLANEOUS) ×1 IMPLANT
SLEVE PROBE SENORX GAMMA FIND (MISCELLANEOUS) ×2 IMPLANT
SUT CHROMIC 3 0 SH 27 (SUTURE) IMPLANT
SUT CHROMIC 4 0 RB 1X27 (SUTURE) ×3 IMPLANT
SUT ETHILON 3-0 FS-10 30 BLK (SUTURE) ×2
SUT MNCRL 4-0 (SUTURE) ×2
SUT MNCRL 4-0 27XMFL (SUTURE) ×1
SUTURE EHLN 3-0 FS-10 30 BLK (SUTURE) ×1 IMPLANT
SUTURE MNCRL 4-0 27XMF (SUTURE) ×1 IMPLANT
SYRINGE 10CC LL (SYRINGE) ×2 IMPLANT
WATER STERILE IRR 1000ML POUR (IV SOLUTION) ×2 IMPLANT

## 2016-10-11 NOTE — Op Note (Signed)
OPERATIVE REPORT  PREOPERATIVE  DIAGNOSIS: . Right breast cancer  POSTOPERATIVE DIAGNOSIS: . Right breast cancer  PROCEDURE: . Right partial mastectomy with axillary sentinel lymph node biopsy  ANESTHESIA:  General  SURGEON: Rochel Brome  MD   INDICATIONS: . She had recent mammograms demonstrating a small cluster of microcalcifications in the lower inner quadrant of the right breast. Stereotactic needle biopsy demonstrated infiltrating mammary carcinoma. Surgery was recommended for definitive treatment. She had preoperative insertion of Kopan's wire. Mammogram images were reviewed prior to incision. These mammograms demonstrated what may be hematoma or seroma at the biopsy site. She also had preoperative injection of radioactive technetium sulfur colloid.  With the patient on the operating table in the supine position under general anesthesia the right arm was placed on a lateral arm support. The dressing was removed from the inferior medial aspect of the right breast exposing the Kopan's wire which entered the medial aspect of the breast. The wire was cut 2 cm from the skin. The breast was prepared with ChloraPrep and draped in a sterile manner. A curvilinear incision was made 4 cm from the nipple from the 2:30 position to the 5:30 position carried down through subcutaneous tissues. Several small bleeding points were cauterized. The dissection was carried down to the wire and determined the site of the thick portion of the wire. There was a hematoma encountered which was proximal a 1.4 cm and blood was evacuated. There was a finding of ecchymosis within the tissues near the biopsy site. Further dissection was carried out removing a mass of tissue which was 3 x 3 x 5 cm in dimension. This was oriented with margin maps suturing markers to the medial lateral cranial caudal superficial and deep margins. This was submitted for specimen mammogram and pathology. The wound was inspected and several small  bleeding points were cauterized hemostasis was subsequently intact. There was no remaining palpable mass within the wound.  Attention was turned to the axilla which was probed with a gamma counter demonstrating the location of radioactivity in the inferior aspect of the axilla. An incision was made and carried down through subcutaneous tissues. There was one traversing artery and vein which was suture ligated with 3-0 chromic proximally and distally and divided. Dissection was carried down deeply within the axilla adjacent to the rib cage finding a lymph node with radioactivity. This lymph node was dissected free from surrounding structures and including a small amount of fat was resected. The ex vivo count was in the range of 2500-3200. The background count was minimal. There was no remaining palpable mass within the inferior aspect of the axilla. The sentinel node was submitted fresh for routine pathology. The wound was inspected again seeing hemostasis was intact.  The specimen mammogram was examined and demonstrating the location of the biopsy marker and the Kopan's wire. The pathologist later called to report that there was extensive ecchymosis and hematoma associated with the biopsy site and no actual tumor was seen at this time and margin evaluation was  deferred to routine studies.  The axillary wound was further inspected and could see hemostasis was intact. The subcuticular tissues were infiltrated with half percent Sensorcaine with epinephrine. The wound was closed with running 4-0 Monocryl subcuticular suture.  The breast wound was further inspected and several tiny bleeding points were cauterized. Hemostasis subsequently appeared to be intact. Tissues surrounding cautery artifact were infiltrated with half percent Sensorcaine with epinephrine. Also subcuticular tissues were infiltrated. The subcutaneous tissues were approximated with interrupted  4-0 chromic. The skin was closed with running 4-0  Monocryl subcutaneous suture. Both wounds were dressed with Dermabond.  The patient tolerated surgery satisfactorily and was then prepared for transfer to the recovery room  [facility].D.

## 2016-10-11 NOTE — Discharge Instructions (Addendum)
AMBULATORY SURGERY  DISCHARGE INSTRUCTIONS   1) The drugs that you were given will stay in your system until tomorrow so for the next 24 hours you should not:  A) Drive an automobile B) Make any legal decisions C) Drink any alcoholic beverage   2) You may resume regular meals tomorrow.  Today it is better to start with liquids and gradually work up to solid foods.  You may eat anything you prefer, but it is better to start with liquids, then soup and crackers, and gradually work up to solid foods.   3) Please notify your doctor immediately if you have any unusual bleeding, trouble breathing, redness and pain at the surgery site, drainage, fever, or pain not relieved by medication.    4) Additional Instructions:        Please contact your physician with any problems or Same Day Surgery at [phone], Monday through Friday 6 am to 4 pm, or [place] [facility] number at [phone].Take Tylenol and/or tramadol as needed for pain.  Should not drive or do anything dangerous was taking tramadol.  May also take ibuprofen if needed.  May shower and blot dry.  Were bra as desired for comfort and support.

## 2016-10-11 NOTE — Anesthesia Post-op Follow-up Note (Cosign Needed)
Anesthesia QCDR form completed.        

## 2016-10-11 NOTE — H&P (Signed)
  She comes in today prepared for right partial mastectomy with axillary sentinel lymph node biopsy. She has had preoperative x-ray needle localization and injection of radioactive technetium sulfur colloid. The right breast has already been marked YES  She reports no change in overall condition since the office visit.  Lab work reviewed  I discussed the plan for surgery.

## 2016-10-11 NOTE — Anesthesia Postprocedure Evaluation (Signed)
Anesthesia Post Note  Patient: Sandy Petty  Procedure(s) Performed: Procedure(s) (LRB): PARTIAL MASTECTOMY WITH NEEDLE LOCALIZATION (Right) SENTINEL NODE BIOPSY (Right)  Patient location during evaluation: PACU Anesthesia Type: General Level of consciousness: awake and alert and oriented Pain management: pain level controlled Vital Signs Assessment: post-procedure vital signs reviewed and stable Respiratory status: spontaneous breathing Cardiovascular status: blood pressure returned to baseline Anesthetic complications: no     Last Vitals:  Vitals:   10/11/16 1605 10/11/16 1621  BP: 128/84 133/85  Pulse: 99 (!) 109  Resp: 12 10  Temp: 36.4 C     Last Pain:  Vitals:   10/11/16 1634  TempSrc:   PainSc: 4                  [name]

## 2016-10-11 NOTE — Anesthesia Procedure Notes (Signed)
Procedure Name: LMA Insertion Date/Time: 10/11/2016 1:51 PM Performed by: Just[facility] Pre-anesthesia Checklist: Patient identified, Patient being monitored, Timeout performed, Emergency Drugs available and Suction available Patient Re-evaluated:Patient Re-evaluated prior to inductionOxygen Delivery Method: Circle system utilized Preoxygenation: Pre-oxygenation with 100% oxygen Intubation Type: IV induction Ventilation: Mask ventilation without difficulty LMA: LMA inserted LMA Size: 4.0 Tube type: Oral Number of attempts: 1 Placement Confirmation: positive ETCO2 and breath sounds checked- equal and bilateral Tube secured with: Tape Dental Injury: Teeth and Oropharynx as per pre-operative assessment

## 2016-10-11 NOTE — Transfer of Care (Signed)
Immediate Anesthesia Transfer of Care Note  Patient: Sandy Petty  Procedure(s) Performed: Procedure(s): PARTIAL MASTECTOMY WITH NEEDLE LOCALIZATION (Right) SENTINEL NODE BIOPSY (Right)  Patient Location: PACU  Anesthesia Type:General  Level of Consciousness: drowsy and patient cooperative  Airway & Oxygen Therapy: Patient Spontanous Breathing and Patient connected to face mask oxygen  Post-op Assessment: Report given to RN and Post -op Vital signs reviewed and stable  Post vital signs: Reviewed and stable  Last Vitals:  Vitals:   10/11/16 1022 10/11/16 1605  BP: (!) 146/85 128/84  Pulse: 65 99  Resp: 18 12  Temp: 36.7 C 36.4 C    Last Pain:  Vitals:   10/11/16 1022  TempSrc: Oral         Complications: No apparent anesthesia complications

## 2016-10-11 NOTE — Anesthesia Preprocedure Evaluation (Signed)
Anesthesia Evaluation  Patient identified by MRN, date of birth, ID band Patient awake    Reviewed: Allergy & Precautions, NPO status , Patient's Chart, lab work & pertinent test results  Airway Mallampati: II  TM Distance: >3 FB     Dental   Pulmonary neg pulmonary ROS,    Pulmonary exam normal        Cardiovascular negative cardio ROS Normal cardiovascular exam     Neuro/Psych Anxiety negative neurological ROS     GI/Hepatic negative GI ROS, Neg liver ROS,   Endo/Other  Hypothyroidism   Renal/GU negative Renal ROS  negative genitourinary   Musculoskeletal negative musculoskeletal ROS (+)   Abdominal Normal abdominal exam  (+)   Peds negative pediatric ROS (+)  Hematology negative hematology ROS (+)   Anesthesia Other Findings   Reproductive/Obstetrics                             Anesthesia Physical Anesthesia Plan  ASA: II  Anesthesia Plan: General   Post-op Pain Management:    Induction: Intravenous  PONV Risk Score and Plan: 3 and Ondansetron, Dexamethasone, Propofol, Midazolam and Treatment may vary due to age or medical condition  Airway Management Planned: LMA  Additional Equipment:   Intra-op Plan:   Post-operative Plan: Extubation in OR  Informed Consent: I have reviewed the patients History and Physical, chart, labs and discussed the procedure including the risks, benefits and alternatives for the proposed anesthesia with the patient or authorized representative who has indicated his/her understanding and acceptance.   Dental advisory given  Plan Discussed with: CRNA and Surgeon  Anesthesia Plan Comments:         Anesthesia Quick Evaluation

## 2016-10-12 ENCOUNTER — Encounter: Payer: Self-pay | Admitting: Surgery

## 2016-10-12 NOTE — Progress Notes (Signed)
Phoned patient as post-op call.  States she had nausea/vomiting and headache through the night.  Taking Tylenol now to relieve discomfort .  Her husband ids coming to pick up pillow to  Relieve axillary discomfort.  Phoned Dr. Thompson Caul office for follow-up appointment which is scheduled for 10/20/16 at 3:30.  Appointment info given to patient.

## 2016-10-14 ENCOUNTER — Other Ambulatory Visit: Payer: Self-pay | Admitting: Pathology

## 2016-10-14 LAB — SURGICAL PATHOLOGY

## 2016-10-18 ENCOUNTER — Inpatient Hospital Stay: Payer: 59 | Attending: Oncology | Admitting: Oncology

## 2016-10-18 ENCOUNTER — Encounter: Payer: Self-pay | Admitting: Oncology

## 2016-10-18 VITALS — BP 133/84 | HR 85 | Temp 98.0°F | Resp 18 | Wt 157.7 lb

## 2016-10-18 DIAGNOSIS — Z79899 Other long term (current) drug therapy: Secondary | ICD-10-CM

## 2016-10-18 DIAGNOSIS — Z170 Estrogen receptor positive status [ER+]: Secondary | ICD-10-CM

## 2016-10-18 DIAGNOSIS — Z803 Family history of malignant neoplasm of breast: Secondary | ICD-10-CM

## 2016-10-18 DIAGNOSIS — C50311 Malignant neoplasm of lower-inner quadrant of right female breast: Secondary | ICD-10-CM | POA: Diagnosis not present

## 2016-10-18 DIAGNOSIS — Z7189 Other specified counseling: Secondary | ICD-10-CM | POA: Insufficient documentation

## 2016-10-18 NOTE — Progress Notes (Signed)
  Oncology Nurse Navigator Documentation  Navigator Location: [facility] (10/18/16 1600)   )Navigator Encounter Type: Follow-up Appt (post-op) (10/18/16 1600)                         Barriers/Navigation Needs: Education;Coordination of Care (10/18/16 1600)       Coordination of Care: Appts (10/18/16 1600) Education Method: Verbal;Teach-back (10/18/16 1600)                Time Spent with Patient: 60 (10/18/16 1600)   Accompanied patient, and husband at her request to post-op visit with Dr. Janese Banks.  Patient requires re-excision for positive margins, which is scheduled for 11/04/16.  She will be scheduled for Radiation Consult.  Dr Janese Banks reviewed antihormonal treatment with patient, which she will start after radiation.  Patient to call for prescription for anastrozole near the end of her radiation treatments.

## 2016-10-18 NOTE — Progress Notes (Signed)
` Hematology/Oncology Consult note [facility]  [phone] [phone]  Patient Care Team: Juluis Pitch, MD as PCP - General (Family Medicine)   Name of the patient: Sandy Petty  [id number]  08-Dec-1960   Date of visit: 10/18/16  Diagnosis- Invasive mammary carcinoma of right breast pathological prognostic stage IA [id number] ER positive, PR positive and her 2 neu negative  Chief complaint/ Reason for visit- discuss pathology results and further management  Heme/Onc history: 1. Patient is a 56 year old female who recently had a normal bilateral screening mammogram on 09/05/2016 which showed calcifications in her right breast warranting further evaluation. This was followed by a diagnostic right breast mammogram which showed 5 x 3 x 2 mm or calcification in the right breast lower inner quadrant. No associated mass.  2. She had stereotactic biopsy of this lesion which showed invasive mammary carcinomato type. 3 mm, grade 2 ER greater than 90% positive, PR 1-10% positive and HER-2/neu negative   3. Patient has been seen by Dr. Tamala Julian and is scheduled to undergo surgery on 10/11/16  4. Patient has been getting mammograms since the age of 89 and has not had any prior abnormal mammograms. She is G1 P1 L1. She used birth control pills for a long time and then switched to IUD which she has had for 10 years and recently had taken out. She has not had any menstrual cycles since the insertion of IUD. Family history significant for breast cancer in her maternal grandmother died in her 85s and pancreatic cancer in her maternal uncle.  5. DIAGNOSIS:  A. RIGHT BREAST MASS; NEEDLE LOCALIZED EXCISION:  - INVASIVE MAMMARY CARCINOMA OF NO SPECIAL TYPE.  - THE INFERIOR MARGIN RESECTION IS POSITIVE.  - BIOPSY SITE CHANGES, MARKER CLIP PRESENT.  - HEMATOMA.  - SEE SUMMARY BELOW.   B. SENTINEL LYMPH NODE; EXCISION:  - NO TUMOR SEEN IN ONE LYMPH NODE  (0/1).    Interval history- doing well. Denies any complaints  ECOG PS- 0 Pain scale- 4- pain at lumpectomy site   Review of systems- Review of Systems  Constitutional: Negative for chills, fever, malaise/fatigue and weight loss.  HENT: Negative for congestion, ear discharge and nosebleeds.   Eyes: Negative for blurred vision.  Respiratory: Negative for cough, hemoptysis, sputum production, shortness of breath and wheezing.   Cardiovascular: Negative for chest pain, palpitations, orthopnea and claudication.  Gastrointestinal: Negative for abdominal pain, blood in stool, constipation, diarrhea, heartburn, melena, nausea and vomiting.  Genitourinary: Negative for dysuria, flank pain, frequency, hematuria and urgency.  Musculoskeletal: Negative for back pain, joint pain and myalgias.  Skin: Negative for rash.  Neurological: Negative for dizziness, tingling, focal weakness, seizures, weakness and headaches.  Endo/Heme/Allergies: Does not bruise/bleed easily.  Psychiatric/Behavioral: Negative for depression and suicidal ideas. The patient does not have insomnia.       Allergies  Allergen Reactions  . Meloxicam Rash  . Sulfa Antibiotics Rash     Past Medical History:  Diagnosis Date  . Anxiety   . Cancer ([place])   . Hypothyroidism      Past Surgical History:  Procedure Laterality Date  . BACK SURGERY     NECK SURGERY-PLATE WITH 4 SCREWS  . BREAST BIOPSY Right 09/19/2016   invasasive breast ca  . BREAST EXCISIONAL BIOPSY Right 10/11/2016   lumpectomy  . CARPAL TUNNEL RELEASE    . COLONOSCOPY  2015  . ELBOW SURGERY     X2  . PARTIAL MASTECTOMY WITH NEEDLE LOCALIZATION  Right 10/11/2016   Procedure: PARTIAL MASTECTOMY WITH NEEDLE LOCALIZATION;  Surgeon: Leonie Green, MD;  Location: [facility] ORS;  Service: General;  Laterality: Right;  . SENTINEL NODE BIOPSY Right 10/11/2016   Procedure: SENTINEL NODE BIOPSY;  Surgeon: Leonie Green, MD;  Location: [facility] ORS;   Service: General;  Laterality: Right;    Social History   Social History  . Marital status: Married    Spouse name: N/A  . Number of children: N/A  . Years of education: N/A   Occupational History  . Not on file.   Social History Main Topics  . Smoking status: Never Smoker  . Smokeless tobacco: Never Used  . Alcohol use Yes     Comment: Social  . Drug use: No  . Sexual activity: Yes   Other Topics Concern  . Not on file   Social History Narrative  . No narrative on file    Family History  Problem Relation Age of Onset  . Breast cancer Maternal Grandmother 91  . Cancer Maternal Grandmother   . Cancer Maternal Uncle   . Cancer Maternal Grandfather      Current Outpatient Prescriptions:  .  buPROPion (WELLBUTRIN SR) 200 MG 12 hr tablet, Take 200 mg by mouth daily. PT JUST STARTED WEANING HERSELF OFF PER PCP-PT WILL BE WEANED OFF BY Sunday 10-09-16 AND WILL ONLY BE TAKING EFFEXOR, Disp: , Rfl:  .  ibuprofen (ADVIL,MOTRIN) 200 MG tablet, Take 600 mg by mouth every 8 (eight) hours as needed (for pain.)., Disp: , Rfl:  .  levothyroxine (SYNTHROID, LEVOTHROID) 125 MCG tablet, Take 125 mcg by mouth daily before breakfast., Disp: , Rfl:  .  traMADol (ULTRAM) 50 MG tablet, Take 1 tablet (50 mg total) by mouth every 4 (four) hours as needed for moderate pain or severe pain., Disp: 16 tablet, Rfl: 0 .  venlafaxine (EFFEXOR) 37.5 MG tablet, Take 37.5 mg by mouth daily. X 1 WEEK AND WILL THEN TAKE BID STARTING Monday 10-10-16, Disp: , Rfl:  .  zolpidem (AMBIEN) 5 MG tablet, Take 5 mg by mouth as needed., Disp: , Rfl:   Physical exam:  Vitals:   10/18/16 0855  BP: 133/84  Pulse: 85  Resp: 18  Temp: 98 F (36.7 C)  TempSrc: Tympanic  Weight: 157 lb 11.2 oz (71.5 kg)   Physical Exam  Constitutional: She is oriented to person, place, and time and well-developed, well-nourished, and in no distress.  HENT:  Head: Normocephalic and atraumatic.  Eyes: Pupils are equal, round, and  reactive to light. EOM are normal.  Neck: Normal range of motion.  Cardiovascular: Normal rate, regular rhythm and normal heart sounds.   Pulmonary/Chest: Effort normal and breath sounds normal.  Abdominal: Soft. Bowel sounds are normal.  Neurological: She is alert and oriented to person, place, and time.  Skin: Skin is warm and dry.   Breast exam was performed in seated and lying down position. Patient is status post right lumpectomy with a well-healed surgical scar. There is diffuse bruising noted around lumpectomy scar  No flowsheet data found. No flowsheet data found.  No images are attached to the encounter.  Nm Sentinel Node Injection  Result Date: 10/11/2016 CLINICAL DATA:  Right breast cancer. EXAM: NUCLEAR MEDICINE BREAST LYMPHOSCINTIGRAPHY RIGHT TECHNIQUE: Intradermal injection of radiopharmaceutical was performed at the 12 o'clock, 3 o'clock, 6 o'clock, and 9 o'clock positions around the right nipple. The patient was then sent to the operating room where the sentinel node(s) were identified and  removed by the surgeon. RADIOPHARMACEUTICALS:  Total of 1 mCi Millipore-filtered Technetium-61msulfur colloid, injected in four aliquots of 0.25 mCi each. IMPRESSION: Uncomplicated intradermal injection of a total of 1 mCi Technetium-919mulfur colloid for purposes of sentinel node identification. Electronically Signed   By: M.Jerilynn Mages Shick M.D.   On: 10/11/2016 10:53   Mm Breast Surgical Specimen  Result Date: 10/11/2016 CLINICAL DATA:  Right lumpectomy for invasive mammary carcinoma. EXAM: SPECIMEN RADIOGRAPH OF THE RIGHT BREAST COMPARISON:  Previous exam(s). FINDINGS: Status post excision of the right breast. The wire tip and biopsy marker clip are present and are marked for pathology. IMPRESSION: Specimen radiograph of the right breast. Electronically Signed   By: StClaudie Revering.D.   On: 10/11/2016 15:05   Mm Clip Placement Right  Result Date: 09/19/2016 CLINICAL DATA:  Status post  stereotactic biopsy today for right breast calcifications. EXAM: DIAGNOSTIC RIGHT MAMMOGRAM POST STEREOTACTIC BIOPSY COMPARISON:  Previous exam(s). FINDINGS: Mammographic images were obtained following stereotactic guided biopsy of calcifications within the lower inner quadrant of the right breast. At the conclusion of the procedure, a top hat shaped tissue marker was placed at the biopsy site. The clip is well-positioned at the site of the targeted calcifications. IMPRESSION: Top hat shaped clip well-positioned at the site of the targeted calcifications in the lower inner quadrant of the right breast. Final Assessment: Post Procedure Mammograms for Marker Placement Electronically Signed   By: StFranki Cabot.D.   On: 09/19/2016 13:55   Mm Rt Breast Bx W Loc Dev 1st Lesion Image Bx Spec Stereo Guide  Addendum Date: 09/23/2016   ADDENDUM REPORT: 09/23/2016 08:47 ADDENDUM: Pathology of the right breast biopsy revealed A. BREAST CALCIFICATIONS, RIGHT LOWER INNER QUADRANT; STEREOTACTIC BIOPSY: INVASIVE MAMMARY CARCINOMA, NO SPECIAL TYPE. Size of invasive carcinoma: 3 mm in this sample. Histologic grade of invasive carcinoma: 2. Ductal carcinoma in situ: Present, intermediate grade with associated calcifications and comedo necrosis. Lymphovascular invasion: Not identified. ER/PR/HER2: Immunohistochemistry will be performed on block A2, with reflex to [place] HER2 2+. The results will be reported in an addendum. Comment: The definitive grade will be assigned on the excisional specimen. These findings were communicated to DeAlbionn Dr.Maynard's office on 09/20/2016. Read back procedure was performed. This was found to be concordant with Dr. MaReynolds Bowlmpression and notes. Recommendation:  Surgical and oncology referrals. At the patient's request, results and recommendations were relayed to the patient by phone by Dr. ShBrigitte Pulsen 09/21/16. The patient stated she has done well following the biopsy. All of her questions were  answered. She was encouraged to call the [facility] any further questions or concerns. Results were relayed to Dr. FoJerrilyn Cairourse on 09/21/16 by JoJetta LoutRRLangleyResults and request for referral were relayed to AnAl PimpleRN, nurse navigator for [facility]oint[facility] were made with Dr. SmTamala Juliansurgeon for 09/26/16 and Dr. RaJanese Banksoncologist for 09/30/16 by AnAl PimpleRN. The patient has been notified of the appointment information. Addendum by JoJetta LoutRRA on 09/23/16. Electronically Signed   By: StFranki Cabot.D.   On: 09/23/2016 08:47   Result Date: 09/23/2016 CLINICAL DATA:  Patient with right breast calcifications presents today for stereotactic biopsy. EXAM: RIGHT BREAST STEREOTACTIC CORE NEEDLE BIOPSY COMPARISON:  Previous exams. FINDINGS: The patient and I discussed the procedure of stereotactic-guided biopsy including benefits and alternatives. We discussed the high likelihood of a successful procedure. We discussed the risks of the procedure including infection, bleeding, tissue injury,  clip migration, and inadequate sampling. Informed written consent was given. The usual time out protocol was performed immediately prior to the procedure. Using sterile technique and 1% Lidocaine as local anesthetic, under stereotactic guidance, a 9 gauge vacuum assisted device was used to perform core needle biopsy of calcifications in the lower inner quadrant of the right breast using a medial approach. Specimen radiograph was performed showing calcifications. Specimens with calcifications are identified for pathology. Lesion quadrant: Lower inner quadrant At the conclusion of the procedure, a top hat shaped tissue marker clip was deployed into the biopsy cavity. Follow-up 2-view mammogram was performed and dictated separately. IMPRESSION: Stereotactic-guided biopsy of grouped indeterminate calcifications within the lower inner quadrant of the right breast. No apparent  complications. Electronically Signed: By: Franki Cabot M.D. On: 09/19/2016 13:41   Mm Rt Plc Breast Loc Dev   1st Lesion  Inc Mammo Guide  Result Date: 10/11/2016 CLINICAL DATA:  Recently diagnosed invasive mammary carcinoma in the lower inner quadrant of the right breast. Pre lumpectomy localization. EXAM: NEEDLE LOCALIZATION OF THE RIGHT BREAST WITH MAMMO GUIDANCE COMPARISON:  Previous exams. FINDINGS: Patient presents for needle localization prior to right lumpectomy. I met with the patient and we discussed the procedure of needle localization including benefits and alternatives. We discussed the high likelihood of a successful procedure. We discussed the risks of the procedure, including infection, bleeding, tissue injury, and further surgery. Informed, written consent was given. The usual time-out protocol was performed immediately prior to the procedure. Using mammographic guidance, sterile technique, 1% lidocaine and a 5 cm modified Kopans needle, the recently placed top hat shaped biopsy marker clip in the lower inner quadrant of the right breast was localized using an inferior approach. The images were marked for Dr. Tamala Julian. IMPRESSION: Needle localization right breast. No apparent complications. Electronically Signed   By: Claudie Revering M.D.   On: 10/11/2016 09:17     Assessment and plan- Patient is a 56 y.o. female with newly diagnosed invasive mammary carcinoma of right breast pathological prognostic stage IA [id number] ER positive, PR positive and her 2 neu negative s/p lumpectomy  Patient will be getting re excision of positive inferior margin shortly. Given that her tumor was less than 5 mm on final pathology, she does not need oncotype testing and would therefore not require adjuvant chemotherapy. Patient didn't have any symptoms hormone levels checked and her estradiol level was less than 5, FSH 158 and LH 56.4 based on these values she is in the postmenopausal range. Given that her tumor  was strongly ER positive she would be a candidate for hormone therapy for at least 5 years I discussed the risks and benefits of Arimidex including all but not limited to fatigue, hot flashes, arthralgias and worsening bone health. Patient understands and agrees to proceed. We will plan to start hormone therapy upon completion of radiation. wriiten information about arimidex given to patient. I will obtain baseline bone density scan at this time.   I will refer her to radiation oncology for adjuvant radiation therapy. I will tentatively see her back in 10 weeks' time upon completion of radiation therapy and after she has been on Arimidex for about weeks weeks to assess her tolerance to treatment   Visit Diagnosis 1. Malignant neoplasm of lower-inner quadrant of right breast of female, estrogen receptor positive ([place])   2. Goals of care, counseling/discussion      Dr. Randa Evens, MD, MPH [facility] Pager- [phone] 10/18/2016 11:06  AM

## 2016-10-18 NOTE — Progress Notes (Signed)
Here for follow up after mastectomy

## 2016-10-27 ENCOUNTER — Encounter
Admission: RE | Admit: 2016-10-27 | Discharge: 2016-10-27 | Disposition: A | Discharge status: Home / Self Care | Payer: 59 | Source: Ambulatory Visit | Attending: Surgery | Admitting: Surgery

## 2016-10-27 HISTORY — DX: Adverse effect of unspecified anesthetic, initial encounter: T41.45XA

## 2016-10-27 HISTORY — DX: Other specified postprocedural states: Z98.890

## 2016-10-27 HISTORY — DX: Other specified postprocedural states: R11.2

## 2016-10-27 HISTORY — DX: Other complications of anesthesia, initial encounter: T88.59XA

## 2016-10-27 NOTE — Patient Instructions (Signed)
  Your procedure is scheduled on: 11-04-16  Report to Same Day Surgery 2nd floor medical mall [facility] Entrance-take elevator on left to 2nd floor.  Check in with surgery information desk.) To find out your arrival time please call [phone] between 1PM - 3PM on 11-03-16  Remember: Instructions that are not followed completely may result in serious medical risk, up to and including death, or upon the discretion of your surgeon and anesthesiologist your surgery may need to be rescheduled.    _x___ 1. Do not eat food or drink liquids after midnight. No gum chewing or  hard candies.     __x__ 2. No Alcohol for 24 hours before or after surgery.   __x__3. No Smoking for 24 prior to surgery.   ____  4. Bring all medications with you on the day of surgery if instructed.    __x__ 5. Notify your doctor if there is any change in your medical condition     (cold, fever, infections).     Do not wear jewelry, make-up, hairpins, clips or nail polish.  Do not wear lotions, powders, or perfumes. You may wear deodorant.  Do not shave 48 hours prior to surgery. Men may shave face and neck.  Do not bring valuables to the hospital.    [facility] is not responsible for any belongings or valuables.               Contacts, dentures or bridgework may not be worn into surgery.  Leave your suitcase in the car. After surgery it may be brought to your room.  For patients admitted to the hospital, discharge time is determined by your treatment team.   Patients discharged the day of surgery will not be allowed to drive home.  You will need someone to drive you home and stay with you the night of your procedure.    Please read over the following fact sheets that you were given:   [facility] Preparing for Surgery and or MRSA Information   _x___ Take anti-hypertensive (unless it includes a diuretic), cardiac, seizure, asthma,     anti-reflux and psychiatric medicines. These include:  1. LEVOTHYROXINE  2.  EFFEXOR  3.  4.  5.  6.  ____Fleets enema or Magnesium Citrate as directed.   ____ Use CHG Soap or sage wipes as directed on instruction sheet   ____ Use inhalers on the day of surgery and bring to hospital day of surgery  ____ Stop Metformin and Janumet 2 days prior to surgery.    ____ Take 1/2 of usual insulin dose the night before surgery and none on the morning     surgery.   ____ Follow recommendations from Cardiologist, Pulmonologist or PCP regarding stopping Aspirin, Coumadin, Pllavix ,Eliquis, Effient, or Pradaxa, and Pletal.  X____Stop Anti-inflammatories such as Advil, Aleve, IBUPROFEN, Motrin, Naproxen, Naprosyn, Goodies powders or aspirin products NOW-OK to take Tylenol    ____ Stop supplements until after surgery.     ____ Bring C-Pap to the hospital.

## 2016-10-28 ENCOUNTER — Other Ambulatory Visit: Payer: 59

## 2016-11-04 ENCOUNTER — Ambulatory Visit: Payer: 59 | Admitting: Anesthesiology

## 2016-11-04 ENCOUNTER — Encounter: Payer: Self-pay | Admitting: *Deleted

## 2016-11-04 ENCOUNTER — Encounter
Admission: RE | Disposition: A | Discharge status: Home / Self Care | Payer: Self-pay | Source: Ambulatory Visit | Attending: Surgery

## 2016-11-04 ENCOUNTER — Ambulatory Visit
Admission: RE | Admit: 2016-11-04 | Discharge: 2016-11-04 | Disposition: A | Discharge status: Home / Self Care | Payer: 59 | Source: Ambulatory Visit | Attending: Surgery | Admitting: Surgery

## 2016-11-04 DIAGNOSIS — E039 Hypothyroidism, unspecified: Secondary | ICD-10-CM | POA: Diagnosis not present

## 2016-11-04 DIAGNOSIS — Z170 Estrogen receptor positive status [ER+]: Secondary | ICD-10-CM | POA: Diagnosis not present

## 2016-11-04 DIAGNOSIS — Z882 Allergy status to sulfonamides status: Secondary | ICD-10-CM | POA: Diagnosis not present

## 2016-11-04 DIAGNOSIS — Z888 Allergy status to other drugs, medicaments and biological substances status: Secondary | ICD-10-CM | POA: Insufficient documentation

## 2016-11-04 DIAGNOSIS — F419 Anxiety disorder, unspecified: Secondary | ICD-10-CM | POA: Insufficient documentation

## 2016-11-04 DIAGNOSIS — C50311 Malignant neoplasm of lower-inner quadrant of right female breast: Secondary | ICD-10-CM | POA: Insufficient documentation

## 2016-11-04 DIAGNOSIS — Z833 Family history of diabetes mellitus: Secondary | ICD-10-CM | POA: Diagnosis not present

## 2016-11-04 DIAGNOSIS — Z803 Family history of malignant neoplasm of breast: Secondary | ICD-10-CM | POA: Insufficient documentation

## 2016-11-04 DIAGNOSIS — Z79899 Other long term (current) drug therapy: Secondary | ICD-10-CM | POA: Diagnosis not present

## 2016-11-04 DIAGNOSIS — C50811 Malignant neoplasm of overlapping sites of right female breast: Secondary | ICD-10-CM | POA: Diagnosis not present

## 2016-11-04 DIAGNOSIS — Z8249 Family history of ischemic heart disease and other diseases of the circulatory system: Secondary | ICD-10-CM | POA: Insufficient documentation

## 2016-11-04 DIAGNOSIS — C50911 Malignant neoplasm of unspecified site of right female breast: Secondary | ICD-10-CM | POA: Diagnosis not present

## 2016-11-04 HISTORY — PX: MASTECTOMY, PARTIAL: SHX709

## 2016-11-04 LAB — POCT PREGNANCY, URINE: PREG TEST UR: NEGATIVE

## 2016-11-04 SURGERY — MASTECTOMY PARTIAL
Anesthesia: General | Site: Breast | Laterality: Right | Wound class: Clean

## 2016-11-04 MED ORDER — ONDANSETRON HCL 4 MG/2ML IJ SOLN
INTRAMUSCULAR | Status: DC | PRN
  Administered 2016-11-04 (×2): 4 mg via INTRAVENOUS

## 2016-11-04 MED ORDER — FENTANYL CITRATE (PF) 100 MCG/2ML IJ SOLN
INTRAMUSCULAR | Status: AC
  Administered 2016-11-04: 25 ug via INTRAVENOUS
  Filled 2016-11-04: qty 2

## 2016-11-04 MED ORDER — PROPOFOL 500 MG/50ML IV EMUL
INTRAVENOUS | Status: DC | PRN
  Administered 2016-11-04: 200 ug/kg/min via INTRAVENOUS

## 2016-11-04 MED ORDER — ACETAMINOPHEN 10 MG/ML IV SOLN
INTRAVENOUS | Status: AC
  Filled 2016-11-04: qty 100

## 2016-11-04 MED ORDER — PROPOFOL 10 MG/ML IV BOLUS
INTRAVENOUS | Status: AC
  Filled 2016-11-04: qty 20

## 2016-11-04 MED ORDER — LIDOCAINE HCL (CARDIAC) 20 MG/ML IV SOLN
INTRAVENOUS | Status: DC | PRN
  Administered 2016-11-04: 80 mg via INTRAVENOUS

## 2016-11-04 MED ORDER — EPHEDRINE SULFATE 50 MG/ML IJ SOLN
INTRAMUSCULAR | Status: DC | PRN
  Administered 2016-11-04: 10 mg via INTRAVENOUS

## 2016-11-04 MED ORDER — SCOPOLAMINE 1 MG/3DAYS TD PT72
MEDICATED_PATCH | TRANSDERMAL | Status: AC
  Administered 2016-11-04: 1.5 mg via TRANSDERMAL
  Filled 2016-11-04: qty 1

## 2016-11-04 MED ORDER — FENTANYL CITRATE (PF) 100 MCG/2ML IJ SOLN
INTRAMUSCULAR | Status: AC
  Filled 2016-11-04: qty 2

## 2016-11-04 MED ORDER — DEXAMETHASONE SODIUM PHOSPHATE 10 MG/ML IJ SOLN
INTRAMUSCULAR | Status: DC | PRN
  Administered 2016-11-04: 10 mg via INTRAVENOUS

## 2016-11-04 MED ORDER — DIPHENHYDRAMINE HCL 50 MG/ML IJ SOLN
INTRAMUSCULAR | Status: DC | PRN
  Administered 2016-11-04: 25 mg via INTRAVENOUS

## 2016-11-04 MED ORDER — DEXAMETHASONE SODIUM PHOSPHATE 10 MG/ML IJ SOLN
INTRAMUSCULAR | Status: AC
  Filled 2016-11-04: qty 1

## 2016-11-04 MED ORDER — ONDANSETRON HCL 4 MG/2ML IJ SOLN: 4.0000 mg | Freq: Once | INTRAMUSCULAR | Status: DC | PRN

## 2016-11-04 MED ORDER — FAMOTIDINE 20 MG PO TABS: 20.0000 mg | ORAL_TABLET | Freq: Once | ORAL | Status: DC

## 2016-11-04 MED ORDER — MIDAZOLAM HCL 2 MG/2ML IJ SOLN
INTRAMUSCULAR | Status: AC
  Filled 2016-11-04: qty 2

## 2016-11-04 MED ORDER — PROPOFOL 500 MG/50ML IV EMUL
INTRAVENOUS | Status: AC
  Filled 2016-11-04: qty 50

## 2016-11-04 MED ORDER — DIPHENHYDRAMINE HCL 50 MG/ML IJ SOLN
INTRAMUSCULAR | Status: AC
  Filled 2016-11-04: qty 1

## 2016-11-04 MED ORDER — FENTANYL CITRATE (PF) 100 MCG/2ML IJ SOLN
25.0000 ug | INTRAMUSCULAR | Status: DC | PRN
  Administered 2016-11-04 (×5): 25 ug via INTRAVENOUS

## 2016-11-04 MED ORDER — BUPIVACAINE-EPINEPHRINE 0.5% -1:200000 IJ SOLN
INTRAMUSCULAR | Status: DC | PRN
  Administered 2016-11-04: 13 mL

## 2016-11-04 MED ORDER — ONDANSETRON HCL 4 MG PO TABS: 4.0000 mg | ORAL_TABLET | Freq: Four times a day (QID) | ORAL | 1 refills | Status: DC | PRN

## 2016-11-04 MED ORDER — ACETAMINOPHEN 10 MG/ML IV SOLN
INTRAVENOUS | Status: DC | PRN
  Administered 2016-11-04: 1000 mg via INTRAVENOUS

## 2016-11-04 MED ORDER — LIDOCAINE HCL (PF) 2 % IJ SOLN
INTRAMUSCULAR | Status: AC
  Filled 2016-11-04: qty 2

## 2016-11-04 MED ORDER — MIDAZOLAM HCL 2 MG/2ML IJ SOLN
INTRAMUSCULAR | Status: DC | PRN
  Administered 2016-11-04: 2 mg via INTRAVENOUS

## 2016-11-04 MED ORDER — FENTANYL CITRATE (PF) 100 MCG/2ML IJ SOLN
INTRAMUSCULAR | Status: DC | PRN
  Administered 2016-11-04: 25 ug via INTRAVENOUS
  Administered 2016-11-04: 50 ug via INTRAVENOUS
  Administered 2016-11-04: 25 ug via INTRAVENOUS

## 2016-11-04 MED ORDER — FAMOTIDINE 20 MG PO TABS
ORAL_TABLET | ORAL | Status: AC
  Administered 2016-11-04: 20 mg
  Filled 2016-11-04: qty 1

## 2016-11-04 MED ORDER — ONDANSETRON HCL 4 MG/2ML IJ SOLN
INTRAMUSCULAR | Status: AC
  Filled 2016-11-04: qty 2

## 2016-11-04 MED ORDER — LACTATED RINGERS IV SOLN
INTRAVENOUS | Status: DC
  Administered 2016-11-04: 11:00:00 via INTRAVENOUS

## 2016-11-04 MED ORDER — BUPIVACAINE-EPINEPHRINE (PF) 0.5% -1:200000 IJ SOLN
INTRAMUSCULAR | Status: AC
  Filled 2016-11-04: qty 30

## 2016-11-04 MED ORDER — SCOPOLAMINE 1 MG/3DAYS TD PT72
1.0000 | MEDICATED_PATCH | TRANSDERMAL | Status: DC
  Administered 2016-11-04: 1.5 mg via TRANSDERMAL

## 2016-11-04 MED ORDER — PROPOFOL 10 MG/ML IV BOLUS
INTRAVENOUS | Status: DC | PRN
  Administered 2016-11-04: 140 mg via INTRAVENOUS

## 2016-11-04 SURGICAL SUPPLY — 34 items
ADH SKN CLS APL DERMABOND .7 (GAUZE/BANDAGES/DRESSINGS) ×1
BLADE SURG 15 STRL LF DISP TIS (BLADE) ×1 IMPLANT
BLADE SURG 15 STRL SS (BLADE) ×3
CANISTER SUCT 1200ML W/VALVE (MISCELLANEOUS) ×3 IMPLANT
CHLORAPREP W/TINT 26ML (MISCELLANEOUS) ×3 IMPLANT
CNTNR SPEC 2.5X3XGRAD LEK (MISCELLANEOUS) ×1
CONT SPEC 4OZ STER OR WHT (MISCELLANEOUS) ×2
CONT SPEC 4OZ STRL OR WHT (MISCELLANEOUS) ×1
CONTAINER SPEC 2.5X3XGRAD LEK (MISCELLANEOUS) ×1 IMPLANT
DERMABOND ADVANCED (GAUZE/BANDAGES/DRESSINGS) ×2
DERMABOND ADVANCED .7 DNX12 (GAUZE/BANDAGES/DRESSINGS) ×1 IMPLANT
DRAPE LAPAROTOMY 77X122 PED (DRAPES) ×3 IMPLANT
ELECT REM PT RETURN 9FT ADLT (ELECTROSURGICAL) ×3
ELECTRODE REM PT RTRN 9FT ADLT (ELECTROSURGICAL) ×1 IMPLANT
GLOVE BIO SURGEON STRL SZ7 (GLOVE) ×8 IMPLANT
GLOVE BIO SURGEON STRL SZ7.5 (GLOVE) ×5 IMPLANT
GLOVE BIOGEL PI IND STRL 7.5 (GLOVE) IMPLANT
GLOVE BIOGEL PI INDICATOR 7.5 (GLOVE) ×8
GOWN STRL REUS W/ TWL LRG LVL3 (GOWN DISPOSABLE) ×2 IMPLANT
GOWN STRL REUS W/TWL LRG LVL3 (GOWN DISPOSABLE) ×15
KIT RM TURNOVER STRD PROC AR (KITS) ×3 IMPLANT
LABEL OR SOLS (LABEL) ×3 IMPLANT
MARGIN MAP 10MM (MISCELLANEOUS) ×3 IMPLANT
NDL HYPO 25X1 1.5 SAFETY (NEEDLE) ×1 IMPLANT
NEEDLE HYPO 25X1 1.5 SAFETY (NEEDLE) ×3 IMPLANT
PACK BASIN MINOR ARMC (MISCELLANEOUS) ×3 IMPLANT
SUT CHROMIC 4 0 RB 1X27 (SUTURE) ×3 IMPLANT
SUT ETHILON 3-0 FS-10 30 BLK (SUTURE) ×3
SUT MNCRL 4-0 (SUTURE) ×3
SUT MNCRL 4-0 27XMFL (SUTURE) ×1
SUTURE EHLN 3-0 FS-10 30 BLK (SUTURE) ×1 IMPLANT
SUTURE MNCRL 4-0 27XMF (SUTURE) ×1 IMPLANT
SYRINGE 10CC LL (SYRINGE) ×3 IMPLANT
WATER STERILE IRR 1000ML POUR (IV SOLUTION) ×3 IMPLANT

## 2016-11-04 NOTE — Op Note (Signed)
OPERATIVE REPORT  PREOPERATIVE  DIAGNOSIS: . Right breast cancer  POSTOPERATIVE DIAGNOSIS: . Right breast cancer  PROCEDURE: . Right partial mastectomy  ANESTHESIA:  General  SURGEON: Rochel Brome  MD   INDICATIONS: . She had recent findings of cancer in the lower inner quadrant of the right breast. She had recent partial mastectomy. There was a large amount of bruising found during the course of surgery and also uncovered a hematoma. The margins could not adequately be evaluated by the pathologist during surgery due to bruising.. Final pathology demonstrated involvement of the inferior margin. She is now brought for reexcision of the inferior margin.  With the patient on the operating table in the supine position she was placed under general anesthesia. The operative site was evaluated with ultrasound demonstrating the location of a seroma in the lower inner quadrant. The right breast was prepared with ChloraPrep and draped in a sterile manner. The old incision was noted in the lower inner quadrant. The old scar was excised removing about 4 mm of skin on each side. The new incision extended from 2:30 to 5:30 position. Electrocautery was used for hemostasis. Dissection was carried down through subcutaneous tissues to encounter a seroma which was aspirated and opened. There was some minimal degree of nodularity of the inferior wall of the seroma. There was no significant bruising found. The inferior wall of the seroma was excised by making a circular incision from anterior to posterior and medial to lateral and removed a new margin of tissue which was approximately 1 cm in thickness. As this was being dissected margin maps were used to suture markers to the specimen to mark the superficial deep medial lateral and the new caudal or inferior margin. This was submitted fresh for pathology. The wound was inspected and a number of small bleeding points were cauterized. Subcuticular tissues were infiltrated  with half percent Sensorcaine with epinephrine. Also deeper tissues surrounding cautery artifact were infiltrated as well.  The pathologist called to report that the margins appeared to be satisfactory. The pathologist did recognize some minimal degree of nodularity of the seroma wall.  Subcutaneous tissues were approximated with interrupted inverted 4-0 chromic sutures. The skin was closed with running 4-0 Monocryl subcuticular suture and Dermabond  The patient appeared to tolerate the procedure satisfactorily and was then prepared for transfer to the recovery room  [employer].D.

## 2016-11-04 NOTE — Progress Notes (Signed)
Pt does not wish to take any pain med at present

## 2016-11-04 NOTE — OR Nursing (Signed)
Pt to OR with eyeglasses, Damita Lack advises she will get them to the recovery room.

## 2016-11-04 NOTE — H&P (Signed)
  She had recent findings of infiltrating mammary carcinoma of the lower inner quadrant of the right breast. She did have a recent partial mastectomy. Final pathology demonstrated involvement of the inferior margin She comes in today for reexcision of the inferior margin of the lower inner quadrant of the right breast.  She reports no change in overall condition since the recent office visit.  The right side was marked YES  I discussed the plan for surgery

## 2016-11-04 NOTE — Discharge Instructions (Signed)
Take Tylenol and / or tramadol as needed for pain  Should not drive or do anything dangerous when taking tramadol.  Take Zofran if needed for nausea.  Wear bra as desired for comfort and support.  May shower and blot dry.

## 2016-11-04 NOTE — Anesthesia Preprocedure Evaluation (Signed)
Anesthesia Evaluation  Patient identified by MRN, date of birth, ID band Patient awake    Reviewed: Allergy & Precautions, H&P , NPO status , Patient's Chart, lab work & pertinent test results, reviewed documented beta blocker date and time   History of Anesthesia Complications (+) PONV and history of anesthetic complications  Airway Mallampati: II  TM Distance: >3 FB Neck ROM: full    Dental  (+) Teeth Intact   Pulmonary neg pulmonary ROS,    Pulmonary exam normal        Cardiovascular Exercise Tolerance: Good negative cardio ROS Normal cardiovascular exam Rhythm:regular Rate:Normal     Neuro/Psych negative neurological ROS  negative psych ROS   GI/Hepatic negative GI ROS, Neg liver ROS,   Endo/Other  negative endocrine ROSHypothyroidism   Renal/GU negative Renal ROS  negative genitourinary   Musculoskeletal   Abdominal   Peds  Hematology negative hematology ROS (+)   Anesthesia Other Findings   Reproductive/Obstetrics negative OB ROS                             Anesthesia Physical Anesthesia Plan  ASA: II  Anesthesia Plan: General LMA   Post-op Pain Management:    Induction:   PONV Risk Score and Plan:   Airway Management Planned:   Additional Equipment:   Intra-op Plan:   Post-operative Plan:   Informed Consent: I have reviewed the patients History and Physical, chart, labs and discussed the procedure including the risks, benefits and alternatives for the proposed anesthesia with the patient or authorized representative who has indicated his/her understanding and acceptance.     Plan Discussed with: CRNA  Anesthesia Plan Comments:         Anesthesia Quick Evaluation

## 2016-11-04 NOTE — Anesthesia Post-op Follow-up Note (Cosign Needed)
Anesthesia QCDR form completed.        

## 2016-11-04 NOTE — Anesthesia Procedure Notes (Signed)
Procedure Name: LMA Insertion Date/Time: 11/04/2016 11:18 AM Performed by: Silvana Newness Pre-anesthesia Checklist: Patient identified, Emergency Drugs available, Suction available, Patient being monitored and Timeout performed Patient Re-evaluated:Patient Re-evaluated prior to induction Oxygen Delivery Method: Circle system utilized Preoxygenation: Pre-oxygenation with 100% oxygen Induction Type: IV induction Ventilation: Mask ventilation without difficulty LMA: LMA inserted LMA Size: 4.0 Number of attempts: 1 Placement Confirmation: positive ETCO2 and breath sounds checked- equal and bilateral Tube secured with: Tape Dental Injury: Teeth and Oropharynx as per pre-operative assessment

## 2016-11-04 NOTE — Transfer of Care (Signed)
Immediate Anesthesia Transfer of Care Note  Patient: Sandy Petty  Procedure(s) Performed: Procedure(s): RE-EXCISION INFERIOR MARGIN RIGHT BREAST (Right)  Patient Location: PACU  Anesthesia Type:General  Level of Consciousness: drowsy and patient cooperative  Airway & Oxygen Therapy: Patient Spontanous Breathing and Patient connected to face mask oxygen  Post-op Assessment: Report given to RN and Post -op Vital signs reviewed and stable  Post vital signs: Reviewed and stable  Last Vitals:  Vitals:   11/04/16 1027 11/04/16 1303  BP: (!) 146/94 101/65  Pulse: 82 68  Resp: 16 12  Temp: (!) 36.4 C 36.5 C    Last Pain:  Vitals:   11/04/16 1027  TempSrc: Tympanic         Complications: No apparent anesthesia complications

## 2016-11-07 ENCOUNTER — Ambulatory Visit
Admission: RE | Admit: 2016-11-07 | Discharge: 2016-11-07 | Disposition: A | Discharge status: Home / Self Care | Payer: 59 | Source: Ambulatory Visit | Attending: Radiation Oncology | Admitting: Radiation Oncology

## 2016-11-07 ENCOUNTER — Encounter: Payer: Self-pay | Admitting: Radiation Oncology

## 2016-11-07 VITALS — BP 115/70 | HR 85 | Temp 98.7°F | Resp 18 | Wt 156.7 lb

## 2016-11-07 DIAGNOSIS — C50311 Malignant neoplasm of lower-inner quadrant of right female breast: Secondary | ICD-10-CM | POA: Insufficient documentation

## 2016-11-07 DIAGNOSIS — Z79899 Other long term (current) drug therapy: Secondary | ICD-10-CM | POA: Diagnosis not present

## 2016-11-07 DIAGNOSIS — E039 Hypothyroidism, unspecified: Secondary | ICD-10-CM | POA: Diagnosis not present

## 2016-11-07 DIAGNOSIS — Z803 Family history of malignant neoplasm of breast: Secondary | ICD-10-CM | POA: Diagnosis not present

## 2016-11-07 DIAGNOSIS — Z510 Encounter for antineoplastic radiation therapy: Secondary | ICD-10-CM | POA: Diagnosis not present

## 2016-11-07 DIAGNOSIS — F419 Anxiety disorder, unspecified: Secondary | ICD-10-CM | POA: Diagnosis not present

## 2016-11-07 DIAGNOSIS — Z9011 Acquired absence of right breast and nipple: Secondary | ICD-10-CM | POA: Insufficient documentation

## 2016-11-07 DIAGNOSIS — Z170 Estrogen receptor positive status [ER+]: Secondary | ICD-10-CM | POA: Diagnosis not present

## 2016-11-07 LAB — SURGICAL PATHOLOGY

## 2016-11-07 NOTE — Consult Note (Signed)
NEW PATIENT EVALUATION  Name: Sandy Petty  MRN: [id number]  Date:   11/07/2016     DOB: May 30, 1960   This 56 y.o. female patient presents to the clinic for initial evaluation of stage I invasive mammary carcinoma of the right breast status post wide local excision and sentinel node biopsy ER/PR positive HER-2/neu not overexpressed.  REFERRING PHYSICIAN: Juluis Pitch, MD  CHIEF COMPLAINT:  Chief Complaint  Patient presents with  . Breast Cancer    Pt is here for initial consultation of breast cancer.      DIAGNOSIS: The encounter diagnosis was Malignant neoplasm of lower-inner quadrant of right breast of female, estrogen receptor positive ([place]).   PREVIOUS INVESTIGATIONS:  Mammogram and ultrasound reviewed Clinical notes reviewed Pathology reports reviewed  HPI: patient is a 56 year old female who presented with an abnormal mammogramshowing a 5 mm group of indeterminate right lower inner quadrant calcifications for which stereotactic core biopsy was recommended. Biopsy was positive for invasive mammary carcinoma ER/PR positive HER-2/neu not overexpressed.tumor size was 4 mm tumor was overall nuclear grade 2.inferior margin was positive patient has had reexcision last week. One sentinel lymph node was negative for metastatic disease.she's been seen by medical oncology based on the small nature of her lesion systemic chemotherapy was not met recommended. She is seen today for radiation oncology opinion. She is still somewhat sore from her reexcision.  PLANNED TREATMENT REGIMEN: whole breast radiation  PAST MEDICAL HISTORY:  has a past medical history of Anxiety; Cancer ([place]); Complication of anesthesia; Hypothyroidism; and PONV (postoperative nausea and vomiting).    PAST SURGICAL HISTORY:  Past Surgical History:  Procedure Laterality Date  . BACK SURGERY     NECK SURGERY-PLATE WITH 4 SCREWS  . BREAST BIOPSY Right 09/19/2016   invasasive breast ca  . BREAST EXCISIONAL BIOPSY  Right 10/11/2016   lumpectomy  . CARPAL TUNNEL RELEASE    . COLONOSCOPY  2015  . ELBOW SURGERY     X2  . MASTECTOMY, PARTIAL Right 11/04/2016   Procedure: RE-EXCISION INFERIOR MARGIN RIGHT BREAST;  Surgeon: Leonie Green, MD;  Location: [facility] ORS;  Service: General;  Laterality: Right;  . PARTIAL MASTECTOMY WITH NEEDLE LOCALIZATION Right 10/11/2016   Procedure: PARTIAL MASTECTOMY WITH NEEDLE LOCALIZATION;  Surgeon: Leonie Green, MD;  Location: [facility] ORS;  Service: General;  Laterality: Right;  . SENTINEL NODE BIOPSY Right 10/11/2016   Procedure: SENTINEL NODE BIOPSY;  Surgeon: Leonie Green, MD;  Location: [facility] ORS;  Service: General;  Laterality: Right;    FAMILY HISTORY: family history includes Breast cancer (age of onset: 74) in her maternal grandmother; Cancer in her maternal grandfather, maternal grandmother, and maternal uncle.  SOCIAL HISTORY:  reports that she has never smoked. She has never used smokeless tobacco. She reports that she drinks alcohol. She reports that she does not use drugs.  ALLERGIES: Meloxicam and Sulfa antibiotics  MEDICATIONS:  Current Outpatient Prescriptions  Medication Sig Dispense Refill  . ibuprofen (ADVIL,MOTRIN) 200 MG tablet Take 600 mg by mouth every 8 (eight) hours as needed (for pain.).    Marland Kitchen levothyroxine (SYNTHROID, LEVOTHROID) 125 MCG tablet Take 125 mcg by mouth daily before breakfast.    . ondansetron (ZOFRAN) 4 MG tablet Take 1 tablet (4 mg total) by mouth every 6 (six) hours as needed for nausea or vomiting. 6 tablet 1  . venlafaxine (EFFEXOR) 37.5 MG tablet Take 37.5 mg by mouth 2 (two) times daily.     Marland Kitchen zolpidem (AMBIEN) 5 MG tablet Take  5 mg by mouth at bedtime as needed for sleep.     . traMADol (ULTRAM) 50 MG tablet Take 1 tablet (50 mg total) by mouth every 4 (four) hours as needed for moderate pain or severe pain. (Patient not taking: Reported on 10/18/2016) 16 tablet 0   No current facility-administered medications  for this encounter.     ECOG PERFORMANCE STATUS:  0 - Asymptomatic  REVIEW OF SYSTEMS:  Patient denies any weight loss, fatigue, weakness, fever, chills or night sweats. Patient denies any loss of vision, blurred vision. Patient denies any ringing  of the ears or hearing loss. No irregular heartbeat. Patient denies heart murmur or history of fainting. Patient denies any chest pain or pain radiating to her upper extremities. Patient denies any shortness of breath, difficulty breathing at night, cough or hemoptysis. Patient denies any swelling in the lower legs. Patient denies any nausea vomiting, vomiting of blood, or coffee ground material in the vomitus. Patient denies any stomach pain. Patient states has had normal bowel movements no significant constipation or diarrhea. Patient denies any dysuria, hematuria or significant nocturia. Patient denies any problems walking, swelling in the joints or loss of balance. Patient denies any skin changes, loss of hair or loss of weight. Patient denies any excessive worrying or anxiety or significant depression. Patient denies any problems with insomnia. Patient denies excessive thirst, polyuria, polydipsia. Patient denies any swollen glands, patient denies easy bruising or easy bleeding. Patient denies any recent infections, allergies or URI. Patient "s visual fields have not changed significantly in recent time.    PHYSICAL EXAM: BP 115/70   Pulse 85   Temp 98.7 F (37.1 C)   Resp 18   Wt 156 lb 12 oz (71.1 kg)   BMI 26.08 kg/m  Well-developed female. Breasts are large. She status post recent wide local excision of the right breast which is healing well. No dominant mass or nodularity is noted in either breast in 2 positions examined. No axillary or supraclavicular adenopathy is appreciated. Well-developed well-nourished patient in NAD. HEENT reveals PERLA, EOMI, discs not visualized.  Oral cavity is clear. No oral mucosal lesions are identified. Neck is  clear without evidence of cervical or supraclavicular adenopathy. Lungs are clear to A&P. Cardiac examination is essentially unremarkable with regular rate and rhythm without murmur rub or thrill. Abdomen is benign with no organomegaly or masses noted. Motor sensory and DTR levels are equal and symmetric in the upper and lower extremities. Cranial nerves II through XII are grossly intact. Proprioception is intact. No peripheral adenopathy or edema is identified. No motor or sensory levels are noted. Crude visual fields are within normal range.  LABORATORY DATA: pathology report reviewed    RADIOLOGY RESULTS:mammogram\ reviewed   IMPRESSION: stage I ER/PR positive invasive mammary carcinoma the right breast as was wide local excision and sentinel node biopsy plus reexcisionin 56 year old female  PLAN: this time I recommended whole breast radiation. Based on her large breast volume would plan on delivering 5040 cGy in 28 fractions. I would also boost her scar another 1400 cGy using electron beam. Will review her final pathology prior to initiating treatment to make sure we have a clean margin. Risks and benefits of treatment including skin reaction fatigue alteration of blood counts possible inclusion of superficial lung all were discussed in detail with the patient and her husband. I personally set up and ordered CT simulation for next week to allow some further healing. Patient also will be candidate for antiestrogen therapy  after completion of radiation.  I would like to take this opportunity to thank you for allowing me to participate in the care of your patient.Armstead Peaks., MD

## 2016-11-08 NOTE — Anesthesia Postprocedure Evaluation (Signed)
Anesthesia Post Note  Patient: Sandy Petty  Procedure(s) Performed: Procedure(s) (LRB): RE-EXCISION INFERIOR MARGIN RIGHT BREAST (Right)  Patient location during evaluation: PACU Anesthesia Type: General Level of consciousness: awake and alert Pain management: pain level controlled Vital Signs Assessment: post-procedure vital signs reviewed and stable Respiratory status: spontaneous breathing, nonlabored ventilation, respiratory function stable and patient connected to nasal cannula oxygen Cardiovascular status: blood pressure returned to baseline and stable Postop Assessment: no signs of nausea or vomiting Anesthetic complications: no     Last Vitals:  Vitals:   11/04/16 1412 11/04/16 1529  BP: 131/75 125/70  Pulse: 82 65  Resp: 16   Temp: (!) 36.1 C     Last Pain:  Vitals:   11/04/16 1529  TempSrc:   PainSc: [place]

## 2016-11-10 ENCOUNTER — Encounter: Payer: Self-pay | Admitting: Oncology

## 2016-11-16 ENCOUNTER — Ambulatory Visit
Admission: RE | Admit: 2016-11-16 | Discharge: 2016-11-16 | Disposition: A | Discharge status: Home / Self Care | Payer: 59 | Source: Ambulatory Visit | Attending: Radiation Oncology | Admitting: Radiation Oncology

## 2016-11-16 DIAGNOSIS — Z170 Estrogen receptor positive status [ER+]: Secondary | ICD-10-CM | POA: Diagnosis not present

## 2016-11-16 DIAGNOSIS — Z510 Encounter for antineoplastic radiation therapy: Secondary | ICD-10-CM | POA: Diagnosis not present

## 2016-11-16 DIAGNOSIS — C50311 Malignant neoplasm of lower-inner quadrant of right female breast: Secondary | ICD-10-CM | POA: Diagnosis not present

## 2016-11-17 DIAGNOSIS — Z510 Encounter for antineoplastic radiation therapy: Secondary | ICD-10-CM | POA: Diagnosis not present

## 2016-11-17 DIAGNOSIS — Z170 Estrogen receptor positive status [ER+]: Secondary | ICD-10-CM | POA: Diagnosis not present

## 2016-11-17 DIAGNOSIS — C50311 Malignant neoplasm of lower-inner quadrant of right female breast: Secondary | ICD-10-CM | POA: Diagnosis not present

## 2016-11-21 ENCOUNTER — Other Ambulatory Visit: Payer: Self-pay | Admitting: *Deleted

## 2016-11-21 DIAGNOSIS — C50311 Malignant neoplasm of lower-inner quadrant of right female breast: Secondary | ICD-10-CM

## 2016-11-23 ENCOUNTER — Ambulatory Visit
Admission: RE | Admit: 2016-11-23 | Discharge: 2016-11-23 | Disposition: A | Discharge status: Home / Self Care | Payer: 59 | Source: Ambulatory Visit | Attending: Radiation Oncology | Admitting: Radiation Oncology

## 2016-11-23 DIAGNOSIS — Z170 Estrogen receptor positive status [ER+]: Secondary | ICD-10-CM | POA: Diagnosis not present

## 2016-11-23 DIAGNOSIS — Z510 Encounter for antineoplastic radiation therapy: Secondary | ICD-10-CM | POA: Diagnosis not present

## 2016-11-23 DIAGNOSIS — C50311 Malignant neoplasm of lower-inner quadrant of right female breast: Secondary | ICD-10-CM | POA: Diagnosis not present

## 2016-11-24 ENCOUNTER — Ambulatory Visit
Admission: RE | Admit: 2016-11-24 | Discharge: 2016-11-24 | Disposition: A | Discharge status: Home / Self Care | Payer: 59 | Source: Ambulatory Visit | Attending: Radiation Oncology | Admitting: Radiation Oncology

## 2016-11-24 DIAGNOSIS — Z510 Encounter for antineoplastic radiation therapy: Secondary | ICD-10-CM | POA: Diagnosis not present

## 2016-11-25 ENCOUNTER — Ambulatory Visit
Admission: RE | Admit: 2016-11-25 | Discharge: 2016-11-25 | Disposition: A | Discharge status: Home / Self Care | Payer: 59 | Source: Ambulatory Visit | Attending: Radiation Oncology | Admitting: Radiation Oncology

## 2016-11-25 DIAGNOSIS — Z510 Encounter for antineoplastic radiation therapy: Secondary | ICD-10-CM | POA: Diagnosis not present

## 2016-11-28 ENCOUNTER — Ambulatory Visit
Admission: RE | Admit: 2016-11-28 | Discharge: 2016-11-28 | Disposition: A | Discharge status: Home / Self Care | Payer: 59 | Source: Ambulatory Visit | Attending: Radiation Oncology | Admitting: Radiation Oncology

## 2016-11-28 DIAGNOSIS — Z510 Encounter for antineoplastic radiation therapy: Secondary | ICD-10-CM | POA: Diagnosis not present

## 2016-11-29 ENCOUNTER — Ambulatory Visit
Admission: RE | Admit: 2016-11-29 | Discharge: 2016-11-29 | Disposition: A | Discharge status: Home / Self Care | Payer: 59 | Source: Ambulatory Visit | Attending: Radiation Oncology | Admitting: Radiation Oncology

## 2016-11-29 DIAGNOSIS — Z510 Encounter for antineoplastic radiation therapy: Secondary | ICD-10-CM | POA: Diagnosis not present

## 2016-11-30 ENCOUNTER — Ambulatory Visit
Admission: RE | Admit: 2016-11-30 | Discharge: 2016-11-30 | Disposition: A | Discharge status: Home / Self Care | Payer: 59 | Source: Ambulatory Visit | Attending: Radiation Oncology | Admitting: Radiation Oncology

## 2016-11-30 DIAGNOSIS — C50311 Malignant neoplasm of lower-inner quadrant of right female breast: Secondary | ICD-10-CM | POA: Diagnosis not present

## 2016-11-30 DIAGNOSIS — Z510 Encounter for antineoplastic radiation therapy: Secondary | ICD-10-CM | POA: Diagnosis not present

## 2016-11-30 DIAGNOSIS — Z170 Estrogen receptor positive status [ER+]: Secondary | ICD-10-CM | POA: Diagnosis not present

## 2016-12-01 ENCOUNTER — Ambulatory Visit
Admission: RE | Admit: 2016-12-01 | Discharge: 2016-12-01 | Disposition: A | Discharge status: Home / Self Care | Payer: 59 | Source: Ambulatory Visit | Attending: Radiation Oncology | Admitting: Radiation Oncology

## 2016-12-01 DIAGNOSIS — Z510 Encounter for antineoplastic radiation therapy: Secondary | ICD-10-CM | POA: Diagnosis not present

## 2016-12-02 ENCOUNTER — Ambulatory Visit
Admission: RE | Admit: 2016-12-02 | Discharge: 2016-12-02 | Disposition: A | Discharge status: Home / Self Care | Payer: 59 | Source: Ambulatory Visit | Attending: Radiation Oncology | Admitting: Radiation Oncology

## 2016-12-02 DIAGNOSIS — Z510 Encounter for antineoplastic radiation therapy: Secondary | ICD-10-CM | POA: Diagnosis not present

## 2016-12-06 ENCOUNTER — Ambulatory Visit
Admission: RE | Admit: 2016-12-06 | Discharge: 2016-12-06 | Disposition: A | Discharge status: Home / Self Care | Payer: 59 | Source: Ambulatory Visit | Attending: Radiation Oncology | Admitting: Radiation Oncology

## 2016-12-06 DIAGNOSIS — Z510 Encounter for antineoplastic radiation therapy: Secondary | ICD-10-CM | POA: Diagnosis not present

## 2016-12-07 ENCOUNTER — Ambulatory Visit
Admission: RE | Admit: 2016-12-07 | Discharge: 2016-12-07 | Disposition: A | Discharge status: Home / Self Care | Payer: 59 | Source: Ambulatory Visit | Attending: Radiation Oncology | Admitting: Radiation Oncology

## 2016-12-07 DIAGNOSIS — Z510 Encounter for antineoplastic radiation therapy: Secondary | ICD-10-CM | POA: Diagnosis not present

## 2016-12-08 ENCOUNTER — Inpatient Hospital Stay: Payer: 59

## 2016-12-08 ENCOUNTER — Ambulatory Visit
Admission: RE | Admit: 2016-12-08 | Discharge: 2016-12-08 | Disposition: A | Discharge status: Home / Self Care | Payer: 59 | Source: Ambulatory Visit | Attending: Radiation Oncology | Admitting: Radiation Oncology

## 2016-12-08 DIAGNOSIS — Z7689 Persons encountering health services in other specified circumstances: Secondary | ICD-10-CM | POA: Diagnosis not present

## 2016-12-08 DIAGNOSIS — C50311 Malignant neoplasm of lower-inner quadrant of right female breast: Secondary | ICD-10-CM | POA: Diagnosis not present

## 2016-12-08 DIAGNOSIS — Z510 Encounter for antineoplastic radiation therapy: Secondary | ICD-10-CM | POA: Diagnosis not present

## 2016-12-08 DIAGNOSIS — Z170 Estrogen receptor positive status [ER+]: Secondary | ICD-10-CM | POA: Diagnosis not present

## 2016-12-09 ENCOUNTER — Ambulatory Visit
Admission: RE | Admit: 2016-12-09 | Discharge: 2016-12-09 | Disposition: A | Discharge status: Home / Self Care | Payer: 59 | Source: Ambulatory Visit | Attending: Radiation Oncology | Admitting: Radiation Oncology

## 2016-12-09 DIAGNOSIS — Z510 Encounter for antineoplastic radiation therapy: Secondary | ICD-10-CM | POA: Diagnosis not present

## 2016-12-12 ENCOUNTER — Ambulatory Visit
Admission: RE | Admit: 2016-12-12 | Discharge: 2016-12-12 | Disposition: A | Discharge status: Home / Self Care | Payer: 59 | Source: Ambulatory Visit | Attending: Radiation Oncology | Admitting: Radiation Oncology

## 2016-12-12 DIAGNOSIS — Z510 Encounter for antineoplastic radiation therapy: Secondary | ICD-10-CM | POA: Diagnosis not present

## 2016-12-13 ENCOUNTER — Other Ambulatory Visit: Payer: 59

## 2016-12-13 ENCOUNTER — Ambulatory Visit
Admission: RE | Admit: 2016-12-13 | Discharge: 2016-12-13 | Disposition: A | Discharge status: Home / Self Care | Payer: 59 | Source: Ambulatory Visit | Attending: Radiation Oncology | Admitting: Radiation Oncology

## 2016-12-13 DIAGNOSIS — Z510 Encounter for antineoplastic radiation therapy: Secondary | ICD-10-CM | POA: Diagnosis not present

## 2016-12-14 ENCOUNTER — Ambulatory Visit
Admission: RE | Admit: 2016-12-14 | Discharge: 2016-12-14 | Disposition: A | Discharge status: Home / Self Care | Payer: 59 | Source: Ambulatory Visit | Attending: Radiation Oncology | Admitting: Radiation Oncology

## 2016-12-14 DIAGNOSIS — Z510 Encounter for antineoplastic radiation therapy: Secondary | ICD-10-CM | POA: Diagnosis not present

## 2016-12-15 ENCOUNTER — Ambulatory Visit
Admission: RE | Admit: 2016-12-15 | Discharge: 2016-12-15 | Disposition: A | Discharge status: Home / Self Care | Payer: 59 | Source: Ambulatory Visit | Attending: Radiation Oncology | Admitting: Radiation Oncology

## 2016-12-15 ENCOUNTER — Ambulatory Visit: Payer: 59

## 2016-12-15 DIAGNOSIS — Z510 Encounter for antineoplastic radiation therapy: Secondary | ICD-10-CM | POA: Diagnosis not present

## 2016-12-15 DIAGNOSIS — C50311 Malignant neoplasm of lower-inner quadrant of right female breast: Secondary | ICD-10-CM | POA: Diagnosis not present

## 2016-12-15 DIAGNOSIS — Z170 Estrogen receptor positive status [ER+]: Secondary | ICD-10-CM | POA: Diagnosis not present

## 2016-12-16 ENCOUNTER — Ambulatory Visit: Payer: 59

## 2016-12-16 ENCOUNTER — Ambulatory Visit
Admission: RE | Admit: 2016-12-16 | Discharge: 2016-12-16 | Disposition: A | Discharge status: Home / Self Care | Payer: 59 | Source: Ambulatory Visit | Attending: Radiation Oncology | Admitting: Radiation Oncology

## 2016-12-16 DIAGNOSIS — Z510 Encounter for antineoplastic radiation therapy: Secondary | ICD-10-CM | POA: Diagnosis not present

## 2016-12-19 ENCOUNTER — Ambulatory Visit
Admission: RE | Admit: 2016-12-19 | Discharge: 2016-12-19 | Disposition: A | Discharge status: Home / Self Care | Payer: 59 | Source: Ambulatory Visit | Attending: Radiation Oncology | Admitting: Radiation Oncology

## 2016-12-19 DIAGNOSIS — Z510 Encounter for antineoplastic radiation therapy: Secondary | ICD-10-CM | POA: Diagnosis not present

## 2016-12-20 ENCOUNTER — Ambulatory Visit: Payer: 59 | Admitting: Oncology

## 2016-12-20 ENCOUNTER — Ambulatory Visit
Admission: RE | Admit: 2016-12-20 | Discharge: 2016-12-20 | Disposition: A | Discharge status: Home / Self Care | Payer: 59 | Source: Ambulatory Visit | Attending: Radiation Oncology | Admitting: Radiation Oncology

## 2016-12-20 DIAGNOSIS — Z510 Encounter for antineoplastic radiation therapy: Secondary | ICD-10-CM | POA: Diagnosis not present

## 2016-12-21 ENCOUNTER — Ambulatory Visit
Admission: RE | Admit: 2016-12-21 | Discharge: 2016-12-21 | Disposition: A | Discharge status: Home / Self Care | Payer: 59 | Source: Ambulatory Visit | Attending: Radiation Oncology | Admitting: Radiation Oncology

## 2016-12-21 DIAGNOSIS — Z510 Encounter for antineoplastic radiation therapy: Secondary | ICD-10-CM | POA: Diagnosis not present

## 2016-12-22 ENCOUNTER — Ambulatory Visit
Admission: RE | Admit: 2016-12-22 | Discharge: 2016-12-22 | Disposition: A | Discharge status: Home / Self Care | Payer: 59 | Source: Ambulatory Visit | Attending: Oncology | Admitting: Oncology

## 2016-12-22 ENCOUNTER — Ambulatory Visit
Admission: RE | Admit: 2016-12-22 | Discharge: 2016-12-22 | Disposition: A | Discharge status: Home / Self Care | Payer: 59 | Source: Ambulatory Visit | Attending: Radiation Oncology | Admitting: Radiation Oncology

## 2016-12-22 ENCOUNTER — Inpatient Hospital Stay: Payer: 59

## 2016-12-22 DIAGNOSIS — Z7689 Persons encountering health services in other specified circumstances: Secondary | ICD-10-CM | POA: Diagnosis not present

## 2016-12-22 DIAGNOSIS — Z780 Asymptomatic menopausal state: Secondary | ICD-10-CM | POA: Diagnosis not present

## 2016-12-22 DIAGNOSIS — Z13820 Encounter for screening for osteoporosis: Secondary | ICD-10-CM | POA: Diagnosis not present

## 2016-12-22 DIAGNOSIS — C50311 Malignant neoplasm of lower-inner quadrant of right female breast: Secondary | ICD-10-CM | POA: Diagnosis not present

## 2016-12-22 DIAGNOSIS — M8580 Other specified disorders of bone density and structure, unspecified site: Secondary | ICD-10-CM | POA: Insufficient documentation

## 2016-12-22 DIAGNOSIS — Z510 Encounter for antineoplastic radiation therapy: Secondary | ICD-10-CM | POA: Diagnosis not present

## 2016-12-22 DIAGNOSIS — Z170 Estrogen receptor positive status [ER+]: Secondary | ICD-10-CM | POA: Insufficient documentation

## 2016-12-22 DIAGNOSIS — Z7189 Other specified counseling: Secondary | ICD-10-CM

## 2016-12-22 DIAGNOSIS — M85852 Other specified disorders of bone density and structure, left thigh: Secondary | ICD-10-CM | POA: Diagnosis not present

## 2016-12-23 ENCOUNTER — Encounter: Payer: Self-pay | Admitting: Oncology

## 2016-12-23 ENCOUNTER — Inpatient Hospital Stay: Payer: 59 | Attending: Oncology | Admitting: Oncology

## 2016-12-23 ENCOUNTER — Ambulatory Visit
Admission: RE | Admit: 2016-12-23 | Discharge: 2016-12-23 | Disposition: A | Discharge status: Home / Self Care | Payer: 59 | Source: Ambulatory Visit | Attending: Radiation Oncology | Admitting: Radiation Oncology

## 2016-12-23 VITALS — BP 128/87 | HR 80 | Temp 99.0°F | Resp 12 | Ht 65.0 in | Wt 156.4 lb

## 2016-12-23 DIAGNOSIS — Z79899 Other long term (current) drug therapy: Secondary | ICD-10-CM | POA: Diagnosis not present

## 2016-12-23 DIAGNOSIS — M8580 Other specified disorders of bone density and structure, unspecified site: Secondary | ICD-10-CM | POA: Diagnosis not present

## 2016-12-23 DIAGNOSIS — Z170 Estrogen receptor positive status [ER+]: Secondary | ICD-10-CM | POA: Insufficient documentation

## 2016-12-23 DIAGNOSIS — C50311 Malignant neoplasm of lower-inner quadrant of right female breast: Secondary | ICD-10-CM | POA: Insufficient documentation

## 2016-12-23 DIAGNOSIS — F419 Anxiety disorder, unspecified: Secondary | ICD-10-CM | POA: Diagnosis not present

## 2016-12-23 DIAGNOSIS — E039 Hypothyroidism, unspecified: Secondary | ICD-10-CM | POA: Diagnosis not present

## 2016-12-23 DIAGNOSIS — Z510 Encounter for antineoplastic radiation therapy: Secondary | ICD-10-CM | POA: Diagnosis not present

## 2016-12-23 NOTE — Progress Notes (Signed)
Hematology/Oncology Consult note [facility]  Telephone:([phone] [phone]  Patient Care Team: Juluis Pitch, MD as PCP - General (Family Medicine)   Name of the patient: Sandy Petty  [id number]  [birth year]   Date of visit: 12/23/16  Diagnosis- Invasive mammary carcinoma of right breast pathological prognostic stage IA [id number] ER positive, PR positive and her 2 neu negative  Chief complaint/ Reason for visit- f/u of breast cancer   Heme/Onc history: 1. Patient is a 56 year old female who recently had a normal bilateral screening mammogram on 09/05/2016 which showed calcifications in her right breast warranting further evaluation. This was followed by a diagnostic right breast mammogram which showed 5 x 3 x 2 mm or calcification in the right breast lower inner quadrant. No associated mass.  2. She had stereotactic biopsy of this lesion which showed invasive mammary carcinomato type. 3 mm, grade 2 ER greater than 90% positive, PR 1-10% positive and HER-2/neu negative   3. Patient has been seen by Dr. Tamala Julian and is scheduled to undergo surgery on 10/11/16  4. Patient has been getting mammograms since the age of 69 and has not had any prior abnormal mammograms. She is G1 P1 L1. She used birth control pills for a long time and then switched to IUD which she has had for 10 years and recently had taken out. She has not had any menstrual cycles since the insertion of IUD. Hormone levels were checked post IUD removal and were in the post menopausal range. Family history significant for breast cancer in her maternal grandmother died in her 83s and pancreatic cancer in her maternal uncle.  5. DIAGNOSIS:  A. RIGHT BREAST MASS; NEEDLE LOCALIZED EXCISION:  - INVASIVE MAMMARY CARCINOMA OF NO SPECIAL TYPE.  - THE INFERIOR MARGIN RESECTION IS POSITIVE.  - BIOPSY SITE CHANGES, MARKER CLIP PRESENT.  - HEMATOMA.  - SEE SUMMARY BELOW.   B. SENTINEL LYMPH  NODE; EXCISION:  - NO TUMOR SEEN IN ONE LYMPH NODE (0/1).   She did have re excision of positive inferior margin which did not reveal any residual tumor.   Interval history- she still has 3 more weeks of radiation to be completed. Overall she is doing well. She reports some fatigue. Denies other complaints  ECOG PS- 0 Pain scale- 0   Review of systems- Review of Systems  Constitutional: Positive for malaise/fatigue. Negative for chills, fever and weight loss.  HENT: Negative for congestion, ear discharge and nosebleeds.   Eyes: Negative for blurred vision.  Respiratory: Negative for cough, hemoptysis, sputum production, shortness of breath and wheezing.   Cardiovascular: Negative for chest pain, palpitations, orthopnea and claudication.  Gastrointestinal: Negative for abdominal pain, blood in stool, constipation, diarrhea, heartburn, melena, nausea and vomiting.  Genitourinary: Negative for dysuria, flank pain, frequency, hematuria and urgency.  Musculoskeletal: Negative for back pain, joint pain and myalgias.  Skin: Negative for rash.  Neurological: Negative for dizziness, tingling, focal weakness, seizures, weakness and headaches.  Endo/Heme/Allergies: Does not bruise/bleed easily.  Psychiatric/Behavioral: Negative for depression and suicidal ideas. The patient does not have insomnia.        Allergies  Allergen Reactions  . Meloxicam Rash  . Sulfa Antibiotics Rash     Past Medical History:  Diagnosis Date  . Anxiety   . Cancer ([place])   . Complication of anesthesia   . Hypothyroidism   . PONV (postoperative nausea and vomiting)    AFTER BREAST SURGERY 10-2016     Past Surgical  History:  Procedure Laterality Date  . BACK SURGERY     NECK SURGERY-PLATE WITH 4 SCREWS  . BREAST BIOPSY Right 09/19/2016   invasasive breast ca  . BREAST EXCISIONAL BIOPSY Right 10/11/2016   lumpectomy  . CARPAL TUNNEL RELEASE    . COLONOSCOPY  2015  . ELBOW SURGERY     X2  .  MASTECTOMY, PARTIAL Right 11/04/2016   Procedure: RE-EXCISION INFERIOR MARGIN RIGHT BREAST;  Surgeon: Leonie Green, MD;  Location: [facility] ORS;  Service: General;  Laterality: Right;  . PARTIAL MASTECTOMY WITH NEEDLE LOCALIZATION Right 10/11/2016   Procedure: PARTIAL MASTECTOMY WITH NEEDLE LOCALIZATION;  Surgeon: Leonie Green, MD;  Location: [facility] OR[facility];  Service: General;  Laterality: Right;  . SENTINEL NODE BIOPSY Right 10/11/2016   Procedure: SENTINEL NODE BIOPSY;  Surgeon: Leonie Green, MD;  Location: [facility] ORS;  Service: General;  Laterality: Right;    Social History   Social History  . Marital status: Married    Spouse name: N/A  . Number of children: N/A  . Years of education: N/A   Occupational History  . Not on file.   Social History Main Topics  . Smoking status: Never Smoker  . Smokeless tobacco: Never Used  . Alcohol use Yes     Comment: Social  . Drug use: No  . Sexual activity: Yes   Other Topics Concern  . Not on file   Social History Narrative  . No narrative on file    Family History  Problem Relation Age of Onset  . Breast cancer Maternal Grandmother 77  . Cancer Maternal Grandmother   . Cancer Maternal Uncle   . Cancer Maternal Grandfather      Current Outpatient Prescriptions:  .  ibuprofen (ADVIL,MOTRIN) 200 MG tablet, Take 600 mg by mouth every 8 (eight) hours as needed (for pain.)., Disp: , Rfl:  .  levothyroxine (SYNTHROID, LEVOTHROID) 125 MCG tablet, Take 125 mcg by mouth daily before breakfast., Disp: , Rfl:  .  ondansetron (ZOFRAN) 4 MG tablet, Take 1 tablet (4 mg total) by mouth every 6 (six) hours as needed for nausea or vomiting., Disp: 6 tablet, Rfl: 1 .  traMADol (ULTRAM) 50 MG tablet, Take 1 tablet (50 mg total) by mouth every 4 (four) hours as needed for moderate pain or severe pain. (Patient not taking: Reported on 10/18/2016), Disp: 16 tablet, Rfl: 0 .  venlafaxine (EFFEXOR) 37.5 MG tablet, Take 37.5 mg by mouth 2 (two)  times daily. , Disp: , Rfl:  .  zolpidem (AMBIEN) 5 MG tablet, Take 5 mg by mouth at bedtime as needed for sleep. , Disp: , Rfl:   Physical exam:  Vitals:   12/23/16 1408  BP: 128/87  Pulse: 80  Resp: 12  Temp: 99 F (37.2 C)  TempSrc: Tympanic  Weight: 156 lb 6.4 oz (70.9 kg)  Height: '5\' 5"'  (1.651 m)   Physical Exam  Constitutional: She is oriented to person, place, and time and well-developed, well-nourished, and in no distress.  HENT:  Head: Normocephalic and atraumatic.  Eyes: Pupils are equal, round, and reactive to light. EOM are normal.  Neck: Normal range of motion.  Cardiovascular: Normal rate, regular rhythm and normal heart sounds.   Pulmonary/Chest: Effort normal and breath sounds normal.  Abdominal: Soft. Bowel sounds are normal.  Neurological: She is alert and oriented to person, place, and time.  Skin: Skin is warm and dry.   some redness noted over skin of left breast.  No flowsheet data found. No flowsheet data found.  No images are attached to the encounter.  Dg Bone Density  Result Date: 12/22/2016 EXAM: DUAL X-RAY ABSORPTIOMETRY (DXA) FOR BONE MINERAL DENSITY IMPRESSION: Dear Dr. Janese Banks, Your patient Halen Mossbarger completed a BMD test on 12/22/2016 using the Delta (analysis version: 14.10) manufactured by [employer]. The following summarizes the results of our evaluation. PATIENT BIOGRAPHICAL: Name: Miaya, Lafontant Patient ID: [id number] Birth Date: Sep 22, 1960 Height: 65.0 in. Gender: Female Exam Date: 12/22/2016 Weight: 156.7 lbs. Indications: Breast CA, Postmenopausal Fractures: Left wrist, Right wrist Treatments: Synthroid ASSESSMENT: The BMD measured at Femur Neck Left is 0.773 g/cm2 with a T-score of -1.9. This patient is considered osteopenic according to [facility]) criteria. L-3 was excluded due to degenerative changes. Site Region Measured Measured WHO Young Adult BMD Date       Age      Classification T-score AP Spine  L1-L4 (L3) 12/22/2016 54.3 Normal -0.1 1.178 g/cm2 DualFemur Neck Left 12/22/2016 54.3 Osteopenia -1.9 0.773 g/cm2 World Health Organization [facility]) criteria for post-menopausal, Caucasian Women: Normal:       T-score at or above -1 SD Osteopenia:   T-score between -1 and -2.5 SD Osteoporosis: T-score at or below -2.5 SD RECOMMENDATIONS: [place] recommends that FDA-approved medical therapies be considered in postmenopausal women and men age 16 or older with a: 1. Hip or vertebral (clinical or morphometric) fracture. 2. T-score of < -2.5 at the spine or hip. 3. Ten-year fracture probability by FRAX of 3% or greater for hip fracture or 20% or greater for major osteoporotic fracture. All treatment decisions require clinical judgment and consideration of individual patient factors, including patient preferences, co-morbidities, previous drug use, risk factors not captured in the FRAX model (e.g. falls, vitamin D deficiency, increased bone turnover, interval significant decline in bone density) and possible under - or over-estimation of fracture risk by FRAX. All patients should ensure an adequate intake of dietary calcium (1200 mg/d) and vitamin D (800 IU daily) unless contraindicated. FOLLOW-UP: People with diagnosed cases of osteoporosis or at high risk for fracture should have regular bone mineral density tests. For patients eligible for Medicare, routine testing is allowed once every 2 years. The testing frequency can be increased to one year for patients who have rapidly progressing disease, those who are receiving or discontinuing medical therapy to restore bone mass, or have additional risk factors. FRAX* RESULTS:  (version: 3.5) 10-year Probability of Fracture1 Major Osteoporotic Fracture2 Hip Fracture 7.3% 0.8% Population: [place] (Caucasian) Risk Factors: None Based on Femur (Left) Neck BMD 1 -The 10-year probability of fracture may be lower than reported if the patient has received  treatment. 2 -Major Osteoporotic Fracture: Clinical Spine, Forearm, Hip or Shoulder *FRAX is a Materials engineer of the [employer] for Metabolic Bone Disease, a [place] (WHO) [facility]. ASSESSMENT: The probability of a major osteoporotic fracture is 7.3% within the next ten years. The probability of a hip fracture is 0.8% within the next ten years. Electronically Signed   By: Earle Gell M.D.   On: 12/22/2016 16:07     Assessment and plan- Patient is a 57 y.o. female  invasive mammary carcinoma of right breast pathological prognostic stage IA [id number] ER positive, PR positive and her 2 neu negative s/p lumpectomy and adjuvant radiation treatment currently on arimidex  1. Osteopenia- Baseline bone density scan shows osteopenia with a T score of -1.9 at the left femur  neck. 10 year probability of a major osteoporotic fracture was 7.3% and hip fracture was 0.8%. I will discuss these results with her. Arimidex can further affect her bone health and I will therefore recommend that she should be on calcium 1200 mg along with vitamin D 800 international units. We will be checking her bone density scan every other year. She does not require as phosphonate [place] on her track score at this time.  2. Stage I breast cancer: discussed risks and benefits of arimidex versus tamoxifen. Discussed risk of worsening bone health, arthralgias, hypercholesterolemia. She agrees to proceed with arimidex upon completion of RT which we will prescribe after 3 weeks. Written information about arimidex handed over to the patient.  I will see her 9 weeks from now with cmp  Visit Diagnosis 1. Malignant neoplasm of lower-inner quadrant of right breast of female, estrogen receptor positive ([place])      Dr. Randa Evens, MD, MPH [facility] Pager- [phone] 12/23/2016 2:48 PM

## 2016-12-23 NOTE — Progress Notes (Signed)
Patient here for follow up she has started  Radiation treatment  and reports feeling tired.

## 2016-12-26 ENCOUNTER — Ambulatory Visit
Admission: RE | Admit: 2016-12-26 | Discharge: 2016-12-26 | Disposition: A | Discharge status: Home / Self Care | Payer: 59 | Source: Ambulatory Visit | Attending: Radiation Oncology | Admitting: Radiation Oncology

## 2016-12-26 ENCOUNTER — Ambulatory Visit: Payer: 59

## 2016-12-26 DIAGNOSIS — Z510 Encounter for antineoplastic radiation therapy: Secondary | ICD-10-CM | POA: Diagnosis not present

## 2016-12-27 ENCOUNTER — Ambulatory Visit
Admission: RE | Admit: 2016-12-27 | Discharge: 2016-12-27 | Disposition: A | Discharge status: Home / Self Care | Payer: 59 | Source: Ambulatory Visit | Attending: Radiation Oncology | Admitting: Radiation Oncology

## 2016-12-27 DIAGNOSIS — Z510 Encounter for antineoplastic radiation therapy: Secondary | ICD-10-CM | POA: Diagnosis not present

## 2016-12-27 DIAGNOSIS — C50311 Malignant neoplasm of lower-inner quadrant of right female breast: Secondary | ICD-10-CM | POA: Diagnosis not present

## 2016-12-27 DIAGNOSIS — Z170 Estrogen receptor positive status [ER+]: Secondary | ICD-10-CM | POA: Diagnosis not present

## 2016-12-28 ENCOUNTER — Ambulatory Visit
Admission: RE | Admit: 2016-12-28 | Discharge: 2016-12-28 | Disposition: A | Discharge status: Home / Self Care | Payer: 59 | Source: Ambulatory Visit | Attending: Radiation Oncology | Admitting: Radiation Oncology

## 2016-12-28 DIAGNOSIS — C50311 Malignant neoplasm of lower-inner quadrant of right female breast: Secondary | ICD-10-CM | POA: Diagnosis not present

## 2016-12-28 DIAGNOSIS — M7502 Adhesive capsulitis of left shoulder: Secondary | ICD-10-CM | POA: Insufficient documentation

## 2016-12-28 DIAGNOSIS — M5412 Radiculopathy, cervical region: Secondary | ICD-10-CM | POA: Diagnosis not present

## 2016-12-28 DIAGNOSIS — Z510 Encounter for antineoplastic radiation therapy: Secondary | ICD-10-CM | POA: Diagnosis not present

## 2016-12-28 DIAGNOSIS — Z170 Estrogen receptor positive status [ER+]: Secondary | ICD-10-CM | POA: Diagnosis not present

## 2016-12-29 ENCOUNTER — Ambulatory Visit
Admission: RE | Admit: 2016-12-29 | Discharge: 2016-12-29 | Disposition: A | Discharge status: Home / Self Care | Payer: 59 | Source: Ambulatory Visit | Attending: Radiation Oncology | Admitting: Radiation Oncology

## 2016-12-29 DIAGNOSIS — C50311 Malignant neoplasm of lower-inner quadrant of right female breast: Secondary | ICD-10-CM | POA: Diagnosis not present

## 2016-12-29 DIAGNOSIS — Z170 Estrogen receptor positive status [ER+]: Secondary | ICD-10-CM | POA: Diagnosis not present

## 2016-12-29 DIAGNOSIS — Z510 Encounter for antineoplastic radiation therapy: Secondary | ICD-10-CM | POA: Diagnosis not present

## 2016-12-30 ENCOUNTER — Ambulatory Visit
Admission: RE | Admit: 2016-12-30 | Discharge: 2016-12-30 | Disposition: A | Discharge status: Home / Self Care | Payer: 59 | Source: Ambulatory Visit | Attending: Radiation Oncology | Admitting: Radiation Oncology

## 2016-12-30 DIAGNOSIS — Z510 Encounter for antineoplastic radiation therapy: Secondary | ICD-10-CM | POA: Diagnosis not present

## 2017-01-02 ENCOUNTER — Ambulatory Visit
Admission: RE | Admit: 2017-01-02 | Discharge: 2017-01-02 | Disposition: A | Discharge status: Home / Self Care | Payer: 59 | Source: Ambulatory Visit | Attending: Radiation Oncology | Admitting: Radiation Oncology

## 2017-01-02 DIAGNOSIS — Z510 Encounter for antineoplastic radiation therapy: Secondary | ICD-10-CM | POA: Diagnosis not present

## 2017-01-03 ENCOUNTER — Ambulatory Visit
Admission: RE | Admit: 2017-01-03 | Discharge: 2017-01-03 | Disposition: A | Discharge status: Home / Self Care | Payer: 59 | Source: Ambulatory Visit | Attending: Radiation Oncology | Admitting: Radiation Oncology

## 2017-01-03 DIAGNOSIS — Z510 Encounter for antineoplastic radiation therapy: Secondary | ICD-10-CM | POA: Diagnosis not present

## 2017-01-04 ENCOUNTER — Ambulatory Visit
Admission: RE | Admit: 2017-01-04 | Discharge: 2017-01-04 | Disposition: A | Discharge status: Home / Self Care | Payer: 59 | Source: Ambulatory Visit | Attending: Radiation Oncology | Admitting: Radiation Oncology

## 2017-01-04 ENCOUNTER — Ambulatory Visit: Payer: 59

## 2017-01-04 ENCOUNTER — Other Ambulatory Visit: Payer: Self-pay | Admitting: *Deleted

## 2017-01-04 DIAGNOSIS — Z510 Encounter for antineoplastic radiation therapy: Secondary | ICD-10-CM | POA: Diagnosis not present

## 2017-01-04 MED ORDER — SILVER SULFADIAZINE 1 % EX CREA: 1.0000 "application " | TOPICAL_CREAM | Freq: Two times a day (BID) | CUTANEOUS | 2 refills | Status: DC

## 2017-01-05 ENCOUNTER — Inpatient Hospital Stay: Payer: 59

## 2017-01-05 ENCOUNTER — Encounter: Payer: Self-pay | Admitting: Radiation Oncology

## 2017-01-05 ENCOUNTER — Ambulatory Visit
Admission: RE | Admit: 2017-01-05 | Discharge: 2017-01-05 | Disposition: A | Discharge status: Home / Self Care | Payer: 59 | Source: Ambulatory Visit | Attending: Radiation Oncology | Admitting: Radiation Oncology

## 2017-01-05 ENCOUNTER — Ambulatory Visit: Payer: 59

## 2017-01-05 DIAGNOSIS — Z170 Estrogen receptor positive status [ER+]: Secondary | ICD-10-CM | POA: Diagnosis not present

## 2017-01-05 DIAGNOSIS — Z510 Encounter for antineoplastic radiation therapy: Secondary | ICD-10-CM | POA: Diagnosis not present

## 2017-01-05 DIAGNOSIS — Z7689 Persons encountering health services in other specified circumstances: Secondary | ICD-10-CM | POA: Diagnosis not present

## 2017-01-05 DIAGNOSIS — C50311 Malignant neoplasm of lower-inner quadrant of right female breast: Secondary | ICD-10-CM | POA: Diagnosis not present

## 2017-01-06 ENCOUNTER — Ambulatory Visit: Payer: 59

## 2017-01-06 ENCOUNTER — Ambulatory Visit
Admission: RE | Admit: 2017-01-06 | Discharge: 2017-01-06 | Disposition: A | Discharge status: Home / Self Care | Payer: 59 | Source: Ambulatory Visit | Attending: Radiation Oncology | Admitting: Radiation Oncology

## 2017-01-06 DIAGNOSIS — Z510 Encounter for antineoplastic radiation therapy: Secondary | ICD-10-CM | POA: Diagnosis not present

## 2017-01-09 ENCOUNTER — Ambulatory Visit
Admission: RE | Admit: 2017-01-09 | Discharge: 2017-01-09 | Disposition: A | Discharge status: Home / Self Care | Payer: 59 | Source: Ambulatory Visit | Attending: Radiation Oncology | Admitting: Radiation Oncology

## 2017-01-09 DIAGNOSIS — Z510 Encounter for antineoplastic radiation therapy: Secondary | ICD-10-CM | POA: Diagnosis not present

## 2017-01-10 ENCOUNTER — Ambulatory Visit
Admission: RE | Admit: 2017-01-10 | Discharge: 2017-01-10 | Disposition: A | Discharge status: Home / Self Care | Payer: 59 | Source: Ambulatory Visit | Attending: Radiation Oncology | Admitting: Radiation Oncology

## 2017-01-10 DIAGNOSIS — Z510 Encounter for antineoplastic radiation therapy: Secondary | ICD-10-CM | POA: Diagnosis not present

## 2017-01-11 ENCOUNTER — Ambulatory Visit
Admission: RE | Admit: 2017-01-11 | Discharge: 2017-01-11 | Disposition: A | Discharge status: Home / Self Care | Payer: 59 | Source: Ambulatory Visit | Attending: Radiation Oncology | Admitting: Radiation Oncology

## 2017-01-11 DIAGNOSIS — Z510 Encounter for antineoplastic radiation therapy: Secondary | ICD-10-CM | POA: Diagnosis not present

## 2017-01-12 ENCOUNTER — Telehealth: Payer: Self-pay | Admitting: *Deleted

## 2017-01-12 ENCOUNTER — Ambulatory Visit
Admission: RE | Admit: 2017-01-12 | Discharge: 2017-01-12 | Disposition: A | Discharge status: Home / Self Care | Payer: 59 | Source: Ambulatory Visit | Attending: Radiation Oncology | Admitting: Radiation Oncology

## 2017-01-12 ENCOUNTER — Other Ambulatory Visit: Payer: Self-pay | Admitting: *Deleted

## 2017-01-12 DIAGNOSIS — C50311 Malignant neoplasm of lower-inner quadrant of right female breast: Secondary | ICD-10-CM | POA: Diagnosis not present

## 2017-01-12 DIAGNOSIS — Z510 Encounter for antineoplastic radiation therapy: Secondary | ICD-10-CM | POA: Diagnosis not present

## 2017-01-12 DIAGNOSIS — Z170 Estrogen receptor positive status [ER+]: Secondary | ICD-10-CM | POA: Diagnosis not present

## 2017-01-12 MED ORDER — ANASTROZOLE 1 MG PO TABS: 1.0000 mg | ORAL_TABLET | Freq: Every day | ORAL | 6 refills | Status: DC

## 2017-01-12 NOTE — Telephone Encounter (Signed)
Pt called today and needs arimidex called into her drugstore and I sent it electronically. She finishes radiation tom. And will start arimidex

## 2017-01-13 ENCOUNTER — Ambulatory Visit: Payer: 59

## 2017-01-13 ENCOUNTER — Ambulatory Visit
Admission: RE | Admit: 2017-01-13 | Discharge: 2017-01-13 | Disposition: A | Discharge status: Home / Self Care | Payer: 59 | Source: Ambulatory Visit | Attending: Radiation Oncology | Admitting: Radiation Oncology

## 2017-01-13 DIAGNOSIS — Z510 Encounter for antineoplastic radiation therapy: Secondary | ICD-10-CM | POA: Diagnosis not present

## 2017-01-16 ENCOUNTER — Ambulatory Visit: Payer: 59

## 2017-01-16 DIAGNOSIS — M7502 Adhesive capsulitis of left shoulder: Secondary | ICD-10-CM | POA: Diagnosis not present

## 2017-01-17 ENCOUNTER — Ambulatory Visit: Payer: 59

## 2017-01-18 DIAGNOSIS — M7502 Adhesive capsulitis of left shoulder: Secondary | ICD-10-CM | POA: Diagnosis not present

## 2017-01-27 ENCOUNTER — Encounter: Payer: Self-pay | Admitting: Oncology

## 2017-01-30 DIAGNOSIS — E039 Hypothyroidism, unspecified: Secondary | ICD-10-CM | POA: Diagnosis not present

## 2017-01-31 ENCOUNTER — Encounter: Payer: Self-pay | Admitting: Radiation Oncology

## 2017-01-31 DIAGNOSIS — M25512 Pain in left shoulder: Secondary | ICD-10-CM | POA: Diagnosis not present

## 2017-01-31 DIAGNOSIS — E109 Type 1 diabetes mellitus without complications: Secondary | ICD-10-CM | POA: Diagnosis not present

## 2017-01-31 DIAGNOSIS — M25612 Stiffness of left shoulder, not elsewhere classified: Secondary | ICD-10-CM | POA: Diagnosis not present

## 2017-02-08 DIAGNOSIS — M25612 Stiffness of left shoulder, not elsewhere classified: Secondary | ICD-10-CM | POA: Diagnosis not present

## 2017-02-08 DIAGNOSIS — M25512 Pain in left shoulder: Secondary | ICD-10-CM | POA: Diagnosis not present

## 2017-02-15 DIAGNOSIS — M25512 Pain in left shoulder: Secondary | ICD-10-CM | POA: Diagnosis not present

## 2017-02-15 DIAGNOSIS — M25612 Stiffness of left shoulder, not elsewhere classified: Secondary | ICD-10-CM | POA: Diagnosis not present

## 2017-02-16 DIAGNOSIS — M7542 Impingement syndrome of left shoulder: Secondary | ICD-10-CM | POA: Diagnosis not present

## 2017-02-16 DIAGNOSIS — M7502 Adhesive capsulitis of left shoulder: Secondary | ICD-10-CM | POA: Diagnosis not present

## 2017-02-16 DIAGNOSIS — M7521 Bicipital tendinitis, right shoulder: Secondary | ICD-10-CM | POA: Diagnosis not present

## 2017-02-17 DIAGNOSIS — M25612 Stiffness of left shoulder, not elsewhere classified: Secondary | ICD-10-CM | POA: Diagnosis not present

## 2017-02-17 DIAGNOSIS — M25512 Pain in left shoulder: Secondary | ICD-10-CM | POA: Diagnosis not present

## 2017-02-20 ENCOUNTER — Ambulatory Visit: Payer: 59 | Attending: Radiation Oncology | Admitting: Radiation Oncology

## 2017-02-21 DIAGNOSIS — M25512 Pain in left shoulder: Secondary | ICD-10-CM | POA: Diagnosis not present

## 2017-02-21 DIAGNOSIS — M25612 Stiffness of left shoulder, not elsewhere classified: Secondary | ICD-10-CM | POA: Diagnosis not present

## 2017-02-27 DIAGNOSIS — Z853 Personal history of malignant neoplasm of breast: Secondary | ICD-10-CM | POA: Diagnosis not present

## 2017-02-28 DIAGNOSIS — M25612 Stiffness of left shoulder, not elsewhere classified: Secondary | ICD-10-CM | POA: Diagnosis not present

## 2017-02-28 DIAGNOSIS — M25512 Pain in left shoulder: Secondary | ICD-10-CM | POA: Diagnosis not present

## 2017-03-02 DIAGNOSIS — M7521 Bicipital tendinitis, right shoulder: Secondary | ICD-10-CM | POA: Diagnosis not present

## 2017-03-02 DIAGNOSIS — M7502 Adhesive capsulitis of left shoulder: Secondary | ICD-10-CM | POA: Diagnosis not present

## 2017-03-03 ENCOUNTER — Ambulatory Visit: Payer: 59 | Admitting: Oncology

## 2017-03-06 DIAGNOSIS — C50919 Malignant neoplasm of unspecified site of unspecified female breast: Secondary | ICD-10-CM | POA: Diagnosis not present

## 2017-03-08 ENCOUNTER — Ambulatory Visit
Admission: RE | Admit: 2017-03-08 | Discharge: 2017-03-08 | Disposition: A | Discharge status: Home / Self Care | Payer: 59 | Source: Ambulatory Visit | Attending: Radiation Oncology | Admitting: Radiation Oncology

## 2017-03-08 ENCOUNTER — Other Ambulatory Visit: Payer: Self-pay

## 2017-03-08 ENCOUNTER — Encounter: Payer: Self-pay | Admitting: Radiation Oncology

## 2017-03-08 VITALS — BP 138/78 | HR 76 | Temp 96.5°F | Resp 20 | Wt 158.1 lb

## 2017-03-08 DIAGNOSIS — Z923 Personal history of irradiation: Secondary | ICD-10-CM | POA: Insufficient documentation

## 2017-03-08 DIAGNOSIS — Z79811 Long term (current) use of aromatase inhibitors: Secondary | ICD-10-CM | POA: Diagnosis not present

## 2017-03-08 DIAGNOSIS — C50311 Malignant neoplasm of lower-inner quadrant of right female breast: Secondary | ICD-10-CM

## 2017-03-08 DIAGNOSIS — Z170 Estrogen receptor positive status [ER+]: Secondary | ICD-10-CM | POA: Insufficient documentation

## 2017-03-08 NOTE — Progress Notes (Signed)
Radiation Oncology Follow up Note  Name: Sandy Petty   Date:   03/08/2017 MRN:  [id number] DOB: 06-02-1960    This 56 y.o. female presents to the clinic today for one-month follow-up status post whole breast radiation to her right breast for ER/PR positive invasive mammary carcinoma stage I.  REFERRING PROVIDER: Juluis Pitch, MD  HPI: Patient is a 56 year old female now seen out 1 month having completed whole breast radiation to her right breast for stage I ER/PR positive invasive mammary carcinoma. She seen today in routine follow-up and is doing well. She specifically denies breast tenderness cough or bone pain.. She's been started on arimadex tolerate that well without side effect.  COMPLICATIONS OF TREATMENT: none  FOLLOW UP COMPLIANCE: keeps appointments   PHYSICAL EXAM:  BP 138/78   Pulse 76   Temp (!) 96.5 F (35.8 C)   Resp 20   Wt 158 lb 1.1 oz (71.7 kg)   BMI 26.30 kg/m  Lungs are clear to A&P cardiac examination essentially unremarkable with regular rate and rhythm. No dominant mass or nodularity is noted in either breast in 2 positions examined. Incision is well-healed. No axillary or supraclavicular adenopathy is appreciated. Cosmetic result is excellent. Well-developed well-nourished patient in NAD. HEENT reveals PERLA, EOMI, discs not visualized.  Oral cavity is clear. No oral mucosal lesions are identified. Neck is clear without evidence of cervical or supraclavicular adenopathy. Lungs are clear to A&P. Cardiac examination is essentially unremarkable with regular rate and rhythm without murmur rub or thrill. Abdomen is benign with no organomegaly or masses noted. Motor sensory and DTR levels are equal and symmetric in the upper and lower extremities. Cranial nerves II through XII are grossly intact. Proprioception is intact. No peripheral adenopathy or edema is identified. No motor or sensory levels are noted. Crude visual fields are within normal range.  RADIOLOGY  RESULTS: No current films for review  PLAN: Present time she is doing well 1 month out. She has started arimadex and tolerating that well. I have asked to see her back in 4-5 months for follow-up. She knows to call sooner with any concerns.  I would like to take this opportunity to thank you for allowing me to participate in the care of your patient.Armstead Peaks., MD

## 2017-03-10 ENCOUNTER — Other Ambulatory Visit: Payer: Self-pay

## 2017-03-10 ENCOUNTER — Inpatient Hospital Stay: Payer: 59 | Attending: Oncology | Admitting: Oncology

## 2017-03-10 ENCOUNTER — Encounter: Payer: Self-pay | Admitting: Oncology

## 2017-03-10 VITALS — BP 135/83 | HR 73 | Temp 97.7°F | Resp 18 | Wt 156.0 lb

## 2017-03-10 DIAGNOSIS — Z79811 Long term (current) use of aromatase inhibitors: Secondary | ICD-10-CM | POA: Insufficient documentation

## 2017-03-10 DIAGNOSIS — Z170 Estrogen receptor positive status [ER+]: Secondary | ICD-10-CM | POA: Diagnosis not present

## 2017-03-10 DIAGNOSIS — M8580 Other specified disorders of bone density and structure, unspecified site: Secondary | ICD-10-CM | POA: Diagnosis not present

## 2017-03-10 DIAGNOSIS — Z923 Personal history of irradiation: Secondary | ICD-10-CM | POA: Diagnosis not present

## 2017-03-10 DIAGNOSIS — E039 Hypothyroidism, unspecified: Secondary | ICD-10-CM | POA: Diagnosis not present

## 2017-03-10 DIAGNOSIS — R5383 Other fatigue: Secondary | ICD-10-CM | POA: Insufficient documentation

## 2017-03-10 DIAGNOSIS — C50311 Malignant neoplasm of lower-inner quadrant of right female breast: Secondary | ICD-10-CM | POA: Insufficient documentation

## 2017-03-10 DIAGNOSIS — Z9011 Acquired absence of right breast and nipple: Secondary | ICD-10-CM | POA: Insufficient documentation

## 2017-03-10 DIAGNOSIS — F419 Anxiety disorder, unspecified: Secondary | ICD-10-CM | POA: Diagnosis not present

## 2017-03-10 NOTE — Progress Notes (Signed)
Here for follow up. Stated since starting Arimidex ( oct 20 ) stated- mild flu like symptoms- achy , stated also having night sweats,and hair loss.  Stated " its tolerable but does not feel like herself "

## 2017-03-10 NOTE — Progress Notes (Signed)
Hematology/Oncology Consult note [facility]  Telephone:([phone] [phone]  Patient Care Team: Juluis Pitch, MD as PCP - General (Family Medicine)   Name of the patient: Sandy Petty  [id number]  07/09/1960   Date of visit: 03/10/17  Diagnosis- Invasive mammary carcinoma of right breast pathological prognostic stage IA [id number] ER positive, PR positive and her 2 neu negative   Chief complaint/ Reason for visit- routine f/u of breast cancer  Heme/Onc history: 1. Patient is a 56 year old female who recently had a normal bilateral screening mammogram on 09/05/2016 which showed calcifications in her right breast warranting further evaluation. This was followed by a diagnostic right breast mammogram which showed 5 x 3 x 2 mm or calcification in the right breast lower inner quadrant. No associated mass.  2. She had stereotactic biopsy of this lesion which showed invasive mammary carcinomato type. 3 mm, grade 2 ER greater than 90% positive, PR 1-10% positive and HER-2/neu negative   3. Patient has been seen by Dr. Tamala Julian and is scheduled to undergo surgery on 10/11/16  4. Patient has been getting mammograms since the age of 70 and has not had any prior abnormal mammograms. She is G1 P1 L1. She used birth control pills for a long time and then switched to IUD which she has had for 10 years and recently had taken out. She has not had any menstrual cycles since the insertion of IUD. Hormone levels were checked post IUD removal and were in the post menopausal range. Family history significant for breast cancer in her maternal grandmother died in her 74s and pancreatic cancer in her maternal uncle.  5. DIAGNOSIS:  A. RIGHT BREAST MASS; NEEDLE LOCALIZED EXCISION:  - INVASIVE MAMMARY CARCINOMA OF NO SPECIAL TYPE.  - THE INFERIOR MARGIN RESECTION IS POSITIVE.  - BIOPSY SITE CHANGES, MARKER CLIP PRESENT.  - HEMATOMA.  - SEE SUMMARY BELOW.   B.  SENTINEL LYMPH NODE; EXCISION:  - NO TUMOR SEEN IN ONE LYMPH NODE (0/1).   She did have re excision of positive inferior margin which did not reveal any residual tumor.   Size of tumor on final path was less than 5 mm and therefore Oncotype testing was not done and she did not require any adjuvant chemotherapy.  She did complete her adjuvant radiation and was started on Arimidex.  Baseline bone density scan showed osteopenia  Interval history-she is tolerating her Arimidex well.  Sometimes she feels achy in her joints but that comes and goes.  She reports feeling fatigued especially over the weekends when she sleeps 12-14 hours a day which was not the case when she was not on hormone therapy.  Reports hot flashes especially at night but not during the day.  She did have some burning and blistering at the site of radiation which is now well-healed  ECOG PS- 0 Pain scale- 0   Review of systems- Review of Systems  Constitutional: Positive for malaise/fatigue. Negative for chills, fever and weight loss.  HENT: Negative for congestion, ear discharge and nosebleeds.   Eyes: Negative for blurred vision.  Respiratory: Negative for cough, hemoptysis, sputum production, shortness of breath and wheezing.   Cardiovascular: Negative for chest pain, palpitations, orthopnea and claudication.  Gastrointestinal: Negative for abdominal pain, blood in stool, constipation, diarrhea, heartburn, melena, nausea and vomiting.  Genitourinary: Negative for dysuria, flank pain, frequency, hematuria and urgency.  Musculoskeletal: Negative for back pain, joint pain and myalgias.  Skin: Negative for rash.  Neurological:  Negative for dizziness, tingling, focal weakness, seizures, weakness and headaches.  Endo/Heme/Allergies: Does not bruise/bleed easily.  Psychiatric/Behavioral: Negative for depression and suicidal ideas. The patient does not have insomnia.       Allergies  Allergen Reactions  . Meloxicam Rash    . Sulfa Antibiotics Rash     Past Medical History:  Diagnosis Date  . Anxiety   . Cancer ([place])   . Complication of anesthesia   . Hypothyroidism   . PONV (postoperative nausea and vomiting)    AFTER BREAST SURGERY 10-2016     Past Surgical History:  Procedure Laterality Date  . BACK SURGERY     NECK SURGERY-PLATE WITH 4 SCREWS  . BREAST BIOPSY Right 09/19/2016   invasasive breast ca  . BREAST EXCISIONAL BIOPSY Right 10/11/2016   lumpectomy  . CARPAL TUNNEL RELEASE    . COLONOSCOPY  2015  . ELBOW SURGERY     X2  . MASTECTOMY, PARTIAL Right 11/04/2016   Procedure: RE-EXCISION INFERIOR MARGIN RIGHT BREAST;  Surgeon: Leonie Green, MD;  Location: [facility] ORS;  Service: General;  Laterality: Right;  . PARTIAL MASTECTOMY WITH NEEDLE LOCALIZATION Right 10/11/2016   Procedure: PARTIAL MASTECTOMY WITH NEEDLE LOCALIZATION;  Surgeon: Leonie Green, MD;  Location: [facility] ORS;  Service: General;  Laterality: Right;  . SENTINEL NODE BIOPSY Right 10/11/2016   Procedure: SENTINEL NODE BIOPSY;  Surgeon: Leonie Green, MD;  Location: [facility] ORS;  Service: General;  Laterality: Right;    Social History   Socioeconomic History  . Marital status: Married    Spouse name: Not on file  . Number of children: Not on file  . Years of education: Not on file  . Highest education level: Not on file  Social Needs  . Financial resource strain: Not on file  . Food insecurity - worry: Not on file  . Food insecurity - inability: Not on file  . Transportation needs - medical: Not on file  . Transportation needs - non-medical: Not on file  Occupational History  . Not on file  Tobacco Use  . Smoking status: Never Smoker  . Smokeless tobacco: Never Used  Substance and Sexual Activity  . Alcohol use: Yes    Comment: Social  . Drug use: No  . Sexual activity: Yes  Other Topics Concern  . Not on file  Social History Narrative  . Not on file    Family History  Problem Relation Age  of Onset  . Breast cancer Maternal Grandmother 59  . Cancer Maternal Grandmother   . Cancer Maternal Uncle   . Cancer Maternal Grandfather      Current Outpatient Medications:  .  anastrozole (ARIMIDEX) 1 MG tablet, Take 1 tablet (1 mg total) by mouth daily., Disp: 30 tablet, Rfl: 6 .  CALCIUM CARBONATE-VITAMIN D PO, Take by mouth., Disp: , Rfl:  .  levothyroxine (SYNTHROID, LEVOTHROID) 112 MCG tablet, , Disp: , Rfl:  .  predniSONE (DELTASONE) 10 MG tablet, 10 mg. , Disp: , Rfl:  .  venlafaxine (EFFEXOR) 37.5 MG tablet, Take 37.5 mg by mouth 2 (two) times daily. , Disp: , Rfl:  .  ibuprofen (ADVIL,MOTRIN) 200 MG tablet, Take 600 mg by mouth every 8 (eight) hours as needed (for pain.)., Disp: , Rfl:  .  methocarbamol (ROBAXIN) 500 MG tablet, , Disp: , Rfl:  .  zolpidem (AMBIEN) 5 MG tablet, Take 5 mg by mouth at bedtime as needed for sleep. , Disp: , Rfl:   Physical exam:  Vitals:   03/10/17 1523  BP: 135/83  Pulse: 73  Resp: 18  Temp: 97.7 F (36.5 C)  TempSrc: Tympanic  Weight: 156 lb (70.8 kg)   Physical Exam  Constitutional: She is oriented to person, place, and time and well-developed, well-nourished, and in no distress.  HENT:  Head: Normocephalic and atraumatic.  Eyes: EOM are normal. Pupils are equal, round, and reactive to light.  Neck: Normal range of motion.  Cardiovascular: Normal rate, regular rhythm and normal heart sounds.  Pulmonary/Chest: Effort normal and breath sounds normal.  Abdominal: Soft. Bowel sounds are normal.  Neurological: She is alert and oriented to person, place, and time.  Skin: Skin is warm and dry.     Assessment and plan- Patient is a 56 y.o. female with history of stage Ia invasive mammary carcinoma of the right breast status post lumpectomy and adjuvant radiation and currently on Arimidex  Patient is tolerating her Arimidex well except for some fatigue and occasional body aches.  She is agreeable to continuing Arimidex at this time  without any changes.  I will see her back in 3 months.  If she continues to have worsening side effects I will plan to switch her to Aromasin.  Patient will also continue to take calcium and vitamin D for her osteopenia.  Her mammograms will be coordinated by Dr. Tamala Julian   Visit Diagnosis 1. Malignant neoplasm of lower-inner quadrant of right breast of female, estrogen receptor positive ([place])      Dr. Randa Evens, MD, MPH [facility] at [facility] Pager- [phone] 03/10/2017 3:51 PM

## 2017-03-29 DIAGNOSIS — M25519 Pain in unspecified shoulder: Secondary | ICD-10-CM | POA: Diagnosis not present

## 2017-04-10 DIAGNOSIS — E0580 Other thyrotoxicosis without thyrotoxic crisis or storm: Secondary | ICD-10-CM | POA: Diagnosis not present

## 2017-04-12 DIAGNOSIS — G5602 Carpal tunnel syndrome, left upper limb: Secondary | ICD-10-CM | POA: Diagnosis not present

## 2017-05-03 DIAGNOSIS — G5602 Carpal tunnel syndrome, left upper limb: Secondary | ICD-10-CM | POA: Diagnosis not present

## 2017-06-05 DIAGNOSIS — C50919 Malignant neoplasm of unspecified site of unspecified female breast: Secondary | ICD-10-CM | POA: Diagnosis not present

## 2017-06-08 ENCOUNTER — Other Ambulatory Visit: Payer: Self-pay | Admitting: *Deleted

## 2017-06-08 DIAGNOSIS — C50311 Malignant neoplasm of lower-inner quadrant of right female breast: Secondary | ICD-10-CM

## 2017-06-08 DIAGNOSIS — Z170 Estrogen receptor positive status [ER+]: Principal | ICD-10-CM

## 2017-06-09 ENCOUNTER — Encounter: Payer: Self-pay | Admitting: Oncology

## 2017-06-09 ENCOUNTER — Inpatient Hospital Stay: Payer: 59 | Attending: Oncology

## 2017-06-09 ENCOUNTER — Inpatient Hospital Stay: Payer: 59 | Admitting: Oncology

## 2017-06-09 ENCOUNTER — Other Ambulatory Visit: Payer: Self-pay | Admitting: *Deleted

## 2017-06-09 VITALS — BP 131/87 | HR 88 | Temp 97.7°F | Resp 16 | Wt 156.0 lb

## 2017-06-09 DIAGNOSIS — Z170 Estrogen receptor positive status [ER+]: Secondary | ICD-10-CM | POA: Diagnosis not present

## 2017-06-09 DIAGNOSIS — C50311 Malignant neoplasm of lower-inner quadrant of right female breast: Secondary | ICD-10-CM | POA: Diagnosis not present

## 2017-06-09 DIAGNOSIS — E039 Hypothyroidism, unspecified: Secondary | ICD-10-CM | POA: Insufficient documentation

## 2017-06-09 DIAGNOSIS — M8580 Other specified disorders of bone density and structure, unspecified site: Secondary | ICD-10-CM | POA: Insufficient documentation

## 2017-06-09 DIAGNOSIS — Z79811 Long term (current) use of aromatase inhibitors: Secondary | ICD-10-CM

## 2017-06-09 DIAGNOSIS — R232 Flushing: Secondary | ICD-10-CM

## 2017-06-09 MED ORDER — EXEMESTANE 25 MG PO TABS: 25.0000 mg | ORAL_TABLET | Freq: Every day | ORAL | 0 refills | Status: DC

## 2017-06-12 NOTE — Progress Notes (Signed)
Hematology/Oncology Consult note [facility]  Telephone:([phone] [phone]  Patient Care Team: Juluis Pitch, MD as PCP - General (Family Medicine)   Name of the patient: Sandy Petty  [id number]  07/22/1960   Date of visit: 06/12/17  Diagnosis- Invasive mammary carcinoma of right breast pathological prognostic stage IA [id number] ER positive, PR positive and her 2 neu negative   Chief complaint/ Reason for visit- routine f/u of breast cancer  Heme/Onc history: 1. Patient is a 57 year old female who recently had a normal bilateral screening mammogram on 09/05/2016 which showed calcifications in her right breast warranting further evaluation. This was followed by a diagnostic right breast mammogram which showed 5 x 3 x 2 mm or calcification in the right breast lower inner quadrant. No associated mass.  2. She had stereotactic biopsy of this lesion which showed invasive mammary carcinomato type. 3 mm, grade 2 ER greater than 90% positive, PR 1-10% positive and HER-2/neu negative   3. Patient has been seen by Dr. Tamala Julian and is scheduled to undergo surgery on 10/11/16  4. Patient has been getting mammograms since the age of 9 and has not had any prior abnormal mammograms. She is G1 P1 L1. She used birth control pills for a long time and then switched to IUD which she has had for 10 years and recently had taken out. She has not had any menstrual cycles since the insertion of IUD.Hormone levels were checked post IUD removal and were in the post menopausal range.Family history significant for breast cancer in her maternal grandmother died in her 81s and pancreatic cancer in her maternal uncle.  5. DIAGNOSIS:  A. RIGHT BREAST MASS; NEEDLE LOCALIZED EXCISION:  - INVASIVE MAMMARY CARCINOMA OF NO SPECIAL TYPE.  - THE INFERIOR MARGIN RESECTION IS POSITIVE.  - BIOPSY SITE CHANGES, MARKER CLIP PRESENT.  - HEMATOMA.  - SEE SUMMARY BELOW.   B.  SENTINEL LYMPH NODE; EXCISION:  - NO TUMOR SEEN IN ONE LYMPH NODE (0/1).   She did have re excision of positive inferior margin which did not reveal any residual tumor.  Size of tumor on final path was less than 5 mm and therefore Oncotype testing was not done and she did not require any adjuvant chemotherapy.  She did complete her adjuvant radiation and was started on Arimidex.  Baseline bone density scan showed osteopenia    Interval history- she has been having a lot of hot flashes with arimidex almost 8-10 times a day making it difficult for her to work. No other side effects  ECOG PS- 0 Pain scale- 0   Review of systems- Review of Systems  Constitutional: Negative for chills, fever, malaise/fatigue and weight loss.  HENT: Negative for congestion, ear discharge and nosebleeds.   Eyes: Negative for blurred vision.  Respiratory: Negative for cough, hemoptysis, sputum production, shortness of breath and wheezing.   Cardiovascular: Negative for chest pain, palpitations, orthopnea and claudication.  Gastrointestinal: Negative for abdominal pain, blood in stool, constipation, diarrhea, heartburn, melena, nausea and vomiting.  Genitourinary: Negative for dysuria, flank pain, frequency, hematuria and urgency.  Musculoskeletal: Negative for back pain, joint pain and myalgias.  Skin: Negative for rash.  Neurological: Negative for dizziness, tingling, focal weakness, seizures, weakness and headaches.  Endo/Heme/Allergies: Does not bruise/bleed easily.       Hot flashes +  Psychiatric/Behavioral: Negative for depression and suicidal ideas. The patient does not have insomnia.     Allergies  Allergen Reactions  . Meloxicam Rash  .  Sulfa Antibiotics Rash     Past Medical History:  Diagnosis Date  . Anxiety   . Cancer ([place])   . Complication of anesthesia   . Hypothyroidism   . PONV (postoperative nausea and vomiting)    AFTER BREAST SURGERY 10-2016     Past Surgical History:    Procedure Laterality Date  . BACK SURGERY     NECK SURGERY-PLATE WITH 4 SCREWS  . BREAST BIOPSY Right 09/19/2016   invasasive breast ca  . BREAST EXCISIONAL BIOPSY Right 10/11/2016   lumpectomy  . CARPAL TUNNEL RELEASE    . COLONOSCOPY  2015  . ELBOW SURGERY     X2  . MASTECTOMY, PARTIAL Right 11/04/2016   Procedure: RE-EXCISION INFERIOR MARGIN RIGHT BREAST;  Surgeon: Leonie Green, MD;  Location: [facility] ORS;  Service: General;  Laterality: Right;  . PARTIAL MASTECTOMY WITH NEEDLE LOCALIZATION Right 10/11/2016   Procedure: PARTIAL MASTECTOMY WITH NEEDLE LOCALIZATION;  Surgeon: Leonie Green, MD;  Location: [facility] ORS;  Service: General;  Laterality: Right;  . SENTINEL NODE BIOPSY Right 10/11/2016   Procedure: SENTINEL NODE BIOPSY;  Surgeon: Leonie Green, MD;  Location: [facility] ORS;  Service: General;  Laterality: Right;    Social History   Socioeconomic History  . Marital status: Married    Spouse name: Not on file  . Number of children: Not on file  . Years of education: Not on file  . Highest education level: Not on file  Social Needs  . Financial resource strain: Not on file  . Food insecurity - worry: Not on file  . Food insecurity - inability: Not on file  . Transportation needs - medical: Not on file  . Transportation needs - non-medical: Not on file  Occupational History  . Not on file  Tobacco Use  . Smoking status: Never Smoker  . Smokeless tobacco: Never Used  Substance and Sexual Activity  . Alcohol use: Yes    Comment: Social  . Drug use: No  . Sexual activity: Yes  Other Topics Concern  . Not on file  Social History Narrative  . Not on file    Family History  Problem Relation Age of Onset  . Breast cancer Maternal Grandmother 23  . Cancer Maternal Grandmother   . Cancer Maternal Uncle   . Cancer Maternal Grandfather      Current Outpatient Medications:  .  CALCIUM CARBONATE-VITAMIN D PO, Take by mouth., Disp: , Rfl:  .  ibuprofen  (ADVIL,MOTRIN) 200 MG tablet, Take 600 mg by mouth every 8 (eight) hours as needed (for pain.)., Disp: , Rfl:  .  levothyroxine (SYNTHROID, LEVOTHROID) 112 MCG tablet, , Disp: , Rfl:  .  venlafaxine (EFFEXOR) 75 MG tablet, Take 75 mg by mouth daily. , Disp: , Rfl:  .  exemestane (AROMASIN) 25 MG tablet, Take 1 tablet (25 mg total) by mouth daily after breakfast., Disp: 30 tablet, Rfl: 0  Physical exam:  Vitals:   06/09/17 1454  BP: 131/87  Pulse: 88  Resp: 16  Temp: 97.7 F (36.5 C)  TempSrc: Tympanic  Weight: 156 lb (70.8 kg)   Physical Exam  Constitutional: She is oriented to person, place, and time and well-developed, well-nourished, and in no distress.  HENT:  Head: Normocephalic and atraumatic.  Eyes: EOM are normal. Pupils are equal, round, and reactive to light.  Neck: Normal range of motion.  Cardiovascular: Normal rate, regular rhythm and normal heart sounds.  Pulmonary/Chest: Effort normal and breath sounds normal.  Abdominal: Soft. Bowel sounds are normal.  Neurological: She is alert and oriented to person, place, and time.  Skin: Skin is warm and dry.      Assessment and plan- Patient is a 57 y.o. female with history of stage Ia invasive mammary carcinoma of the right breast status post lumpectomy and adjuvant radiation and currently on Arimidex  Given that patient is having intolerable hot flashes from arimidex, I will plan to switch her to aromasin at this time. She will let us know if her symptoms persist or improve with change in her medication. If symptoms persist, we have to consider adding citalopram for her hot flashes. Continue calcium and vit D for her osteopenia  I will see her back in 3 months. No labs. Mammograms to be coordinated by Dr. Tamala Julian   Visit Diagnosis 1. Malignant neoplasm of lower-inner quadrant of right breast of female, estrogen receptor positive ([place])   2. Hot flashes   3. Aromatase inhibitor use      Dr. Randa Evens, MD, MPH [facility] at  [facility] Pager- [phone] 06/12/2017 8:35 AM

## 2017-06-14 DIAGNOSIS — G5602 Carpal tunnel syndrome, left upper limb: Secondary | ICD-10-CM | POA: Diagnosis not present

## 2017-07-10 ENCOUNTER — Telehealth: Payer: Self-pay | Admitting: *Deleted

## 2017-07-10 MED ORDER — VENLAFAXINE HCL ER 150 MG PO CP24: 150.0000 mg | ORAL_CAPSULE | Freq: Every day | ORAL | 3 refills | Status: DC

## 2017-07-10 MED ORDER — ANASTROZOLE 1 MG PO TABS: 1.0000 mg | ORAL_TABLET | Freq: Every day | ORAL | 6 refills | Status: DC

## 2017-07-10 NOTE — Telephone Encounter (Signed)
Pt called and states that she would like to switch to another drug due to the side effects. Called pt and she does not like the side effects of the aromasin. I gave her the option to switch to letrozole or go back on arimidex and get something for her hot flashes.  She would like to go back on arimidex and get effexor that she is already on but we will increase the effexor to 150 mg.  Pt wants the meds [facility].

## 2017-07-20 ENCOUNTER — Encounter: Payer: Self-pay | Admitting: Oncology

## 2017-08-23 ENCOUNTER — Ambulatory Visit
Admission: RE | Admit: 2017-08-23 | Discharge: 2017-08-23 | Disposition: A | Discharge status: Home / Self Care | Payer: 59 | Source: Ambulatory Visit | Attending: Radiation Oncology | Admitting: Radiation Oncology

## 2017-08-23 ENCOUNTER — Encounter: Payer: Self-pay | Admitting: Radiation Oncology

## 2017-08-23 ENCOUNTER — Other Ambulatory Visit: Payer: Self-pay | Admitting: *Deleted

## 2017-08-23 ENCOUNTER — Other Ambulatory Visit: Payer: Self-pay

## 2017-08-23 VITALS — BP 130/82 | Temp 98.0°F | Resp 18 | Wt 158.6 lb

## 2017-08-23 DIAGNOSIS — C50311 Malignant neoplasm of lower-inner quadrant of right female breast: Secondary | ICD-10-CM

## 2017-08-23 DIAGNOSIS — Z170 Estrogen receptor positive status [ER+]: Secondary | ICD-10-CM | POA: Diagnosis not present

## 2017-08-23 DIAGNOSIS — Z923 Personal history of irradiation: Secondary | ICD-10-CM | POA: Diagnosis not present

## 2017-08-23 DIAGNOSIS — C50911 Malignant neoplasm of unspecified site of right female breast: Secondary | ICD-10-CM | POA: Insufficient documentation

## 2017-08-23 NOTE — Progress Notes (Signed)
Radiation Oncology Follow up Note  Name: Sandy Petty   Date:   08/23/2017 MRN:  [id number] DOB: 21-May-1960    This 57 y.o. female presents to the clinic today for 6 month follow-up status post whole breast radiation to her right breast for ER/PR positive invasive mammary carcinoma.  REFERRING PROVIDER: Juluis Pitch, MD  HPI: patient is a 57 year old female now seen out6 months having completed whole breast radiation to her right breast for stage I ER/PR positive invasive mammary carcinoma. Seen today in routine follow-up she is doing well. She specifically denies breast tenderness cough or bone pain. She's not had mammograms performed at I am ordering them..she is currently on arimadex tongue that well although she does have some increased arthralgias.  COMPLICATIONS OF TREATMENT: none  FOLLOW UP COMPLIANCE: keeps appointments   PHYSICAL EXAM:  BP 130/82   Temp 98 F (36.7 C)   Resp 18   Wt 158 lb 9.9 oz (72 kg)   BMI 26.40 kg/m  Lungs are clear to A&P cardiac examination essentially unremarkable with regular rate and rhythm. No dominant mass or nodularity is noted in either breast in 2 positions examined. Incision is well-healed. No axillary or supraclavicular adenopathy is appreciated. Cosmetic result is excellent. Well-developed well-nourished patient in NAD. HEENT reveals PERLA, EOMI, discs not visualized.  Oral cavity is clear. No oral mucosal lesions are identified. Neck is clear without evidence of cervical or supraclavicular adenopathy. Lungs are clear to A&P. Cardiac examination is essentially unremarkable with regular rate and rhythm without murmur rub or thrill. Abdomen is benign with no organomegaly or masses noted. Motor sensory and DTR levels are equal and symmetric in the upper and lower extremities. Cranial nerves II through XII are grossly intact. Proprioception is intact. No peripheral adenopathy or edema is identified. No motor or sensory levels are noted. Crude  visual fields are within normal range.  RADIOLOGY RESULTS: diagnostic mammograms ordered  PLAN: present time patient is doing well with no evidence of disease. I am please with her overall progress. She continues on arimadex without side effect. I have ordered bilateral diagnostic mammograms. I've asked to see her back in 6 months and will start once your follow-up visits. Patient knows to call with any concerns.  I would like to take this opportunity to thank you for allowing me to participate in the care of your patient.Noreene Filbert, MD

## 2017-08-30 DIAGNOSIS — Z01419 Encounter for gynecological examination (general) (routine) without abnormal findings: Secondary | ICD-10-CM | POA: Diagnosis not present

## 2017-09-07 DIAGNOSIS — E039 Hypothyroidism, unspecified: Secondary | ICD-10-CM | POA: Diagnosis not present

## 2017-09-07 DIAGNOSIS — Z13220 Encounter for screening for lipoid disorders: Secondary | ICD-10-CM | POA: Diagnosis not present

## 2017-09-07 DIAGNOSIS — Z0184 Encounter for antibody response examination: Secondary | ICD-10-CM | POA: Diagnosis not present

## 2017-09-07 DIAGNOSIS — Z79899 Other long term (current) drug therapy: Secondary | ICD-10-CM | POA: Diagnosis not present

## 2017-09-07 DIAGNOSIS — Z0000 Encounter for general adult medical examination without abnormal findings: Secondary | ICD-10-CM | POA: Diagnosis not present

## 2017-09-08 ENCOUNTER — Ambulatory Visit: Payer: 59 | Admitting: Oncology

## 2017-09-08 ENCOUNTER — Other Ambulatory Visit: Payer: 59

## 2017-09-13 ENCOUNTER — Ambulatory Visit
Admission: RE | Admit: 2017-09-13 | Discharge: 2017-09-13 | Disposition: A | Discharge status: Home / Self Care | Payer: 59 | Source: Ambulatory Visit | Attending: Radiation Oncology | Admitting: Radiation Oncology

## 2017-09-13 DIAGNOSIS — C50311 Malignant neoplasm of lower-inner quadrant of right female breast: Secondary | ICD-10-CM | POA: Diagnosis not present

## 2017-09-13 DIAGNOSIS — Z170 Estrogen receptor positive status [ER+]: Principal | ICD-10-CM

## 2017-09-13 DIAGNOSIS — R922 Inconclusive mammogram: Secondary | ICD-10-CM | POA: Diagnosis not present

## 2017-09-13 HISTORY — DX: Personal history of irradiation: Z92.3

## 2017-09-15 ENCOUNTER — Ambulatory Visit: Payer: 59 | Admitting: Oncology

## 2017-09-15 ENCOUNTER — Other Ambulatory Visit: Payer: 59

## 2017-09-21 ENCOUNTER — Inpatient Hospital Stay: Payer: 59 | Attending: Oncology | Admitting: Oncology

## 2017-09-21 ENCOUNTER — Encounter: Payer: Self-pay | Admitting: Oncology

## 2017-09-21 VITALS — BP 129/84 | HR 83 | Temp 97.5°F | Resp 18 | Ht 65.0 in | Wt 159.4 lb

## 2017-09-21 DIAGNOSIS — Z5181 Encounter for therapeutic drug level monitoring: Secondary | ICD-10-CM

## 2017-09-21 DIAGNOSIS — Z170 Estrogen receptor positive status [ER+]: Secondary | ICD-10-CM | POA: Insufficient documentation

## 2017-09-21 DIAGNOSIS — R232 Flushing: Secondary | ICD-10-CM

## 2017-09-21 DIAGNOSIS — Z79811 Long term (current) use of aromatase inhibitors: Secondary | ICD-10-CM | POA: Insufficient documentation

## 2017-09-21 DIAGNOSIS — C50311 Malignant neoplasm of lower-inner quadrant of right female breast: Secondary | ICD-10-CM | POA: Insufficient documentation

## 2017-09-21 DIAGNOSIS — N951 Menopausal and female climacteric states: Secondary | ICD-10-CM | POA: Insufficient documentation

## 2017-09-21 NOTE — Progress Notes (Signed)
No new changes noted tofday

## 2017-09-22 NOTE — Progress Notes (Signed)
Hematology/Oncology Consult note [facility]  [phone] [phone]  Patient Care Team: Derinda Late, MD as PCP - General (Family Medicine)   Name of the patient: Sandy Petty  [id number]  06/06/1960   Date of visit: 09/22/17  Diagnosis- Invasive mammary carcinoma of right breast pathological prognostic stage IA [id number] ER positive, PR positive and her 2 neu negative   Chief complaint/ Reason for visit- routine f/u of breast cancer on arimidex  Heme/Onc history: 1. Patient is a 57 year old female who recently had a normal bilateral screening mammogram on 09/05/2016 which showed calcifications in her right breast warranting further evaluation. This was followed by a diagnostic right breast mammogram which showed 5 x 3 x 2 mm or calcification in the right breast lower inner quadrant. No associated mass.  2. She had stereotactic biopsy of this lesion which showed invasive mammary carcinomato type. 3 mm, grade 2 ER greater than 90% positive, PR 1-10% positive and HER-2/neu negative   3. Patient has been seen by Dr. Tamala Julian and is scheduled to undergo surgery on 10/11/16  4. Patient has been getting mammograms since the age of 24 and has not had any prior abnormal mammograms. She is G1 P1 L1. She used birth control pills for a long time and then switched to IUD which she has had for 10 years and recently had taken out. She has not had any menstrual cycles since the insertion of IUD.Hormone levels were checked post IUD removal and were in the post menopausal range.Family history significant for breast cancer in her maternal grandmother died in her 59s and pancreatic cancer in her maternal uncle.  5. DIAGNOSIS:  A. RIGHT BREAST MASS; NEEDLE LOCALIZED EXCISION:  - INVASIVE MAMMARY CARCINOMA OF NO SPECIAL TYPE.  - THE INFERIOR MARGIN RESECTION IS POSITIVE.  - BIOPSY SITE CHANGES, MARKER CLIP PRESENT.  - HEMATOMA.  - SEE SUMMARY BELOW.     B. SENTINEL LYMPH NODE; EXCISION:  - NO TUMOR SEEN IN ONE LYMPH NODE (0/1).   She did have re excision of positive inferior margin which did not reveal any residual tumor.  Size of tumor on final path was less than 5 mm and therefore Oncotype testing was not done and she did not require any adjuvant chemotherapy. She did complete her adjuvant radiation and was started on Arimidex. Baseline bone density scan showed osteopenia   Interval history- patient switched transiently to aromasin due to hot flashes froma rimidex. However she could nto tolerate that as well and went back to taking arimidex with a higher dose of effexor 150 mg for her hot flashes. States that she still gets hot flashes but they are not as bad and she is able to function normally  ECOG PS- 0 Pain scale- 0   Review of systems- Review of Systems  Constitutional: Negative for chills, fever, malaise/fatigue and weight loss.  HENT: Negative for congestion, ear discharge and nosebleeds.   Eyes: Negative for blurred vision.  Respiratory: Negative for cough, hemoptysis, sputum production, shortness of breath and wheezing.   Cardiovascular: Negative for chest pain, palpitations, orthopnea and claudication.  Gastrointestinal: Negative for abdominal pain, blood in stool, constipation, diarrhea, heartburn, melena, nausea and vomiting.  Genitourinary: Negative for dysuria, flank pain, frequency, hematuria and urgency.  Musculoskeletal: Negative for back pain, joint pain and myalgias.  Skin: Negative for rash.  Neurological: Negative for dizziness, tingling, focal weakness, seizures, weakness and headaches.  Endo/Heme/Allergies: Does not bruise/bleed easily.  Psychiatric/Behavioral: Negative for depression  and suicidal ideas. The patient does not have insomnia.       Allergies  Allergen Reactions  . Meloxicam Rash  . Sulfa Antibiotics Rash     Past Medical History:  Diagnosis Date  . Anxiety   . Cancer ([place])  10/2016   Right breast  . Complication of anesthesia   . Hypothyroidism   . Personal history of radiation therapy 2018   Breast  . PONV (postoperative nausea and vomiting)    AFTER BREAST SURGERY 10-2016     Past Surgical History:  Procedure Laterality Date  . BACK SURGERY     NECK SURGERY-PLATE WITH 4 SCREWS  . BREAST BIOPSY Right 09/19/2016   invasasive breast ca  . BREAST EXCISIONAL BIOPSY Right 10/11/2016   lumpectomy  . CARPAL TUNNEL RELEASE    . COLONOSCOPY  2015  . ELBOW SURGERY     X2  . MASTECTOMY, PARTIAL Right 11/04/2016   Procedure: RE-EXCISION INFERIOR MARGIN RIGHT BREAST;  Surgeon: Leonie Green, MD;  Location: [facility] ORS;  Service: General;  Laterality: Right;  . PARTIAL MASTECTOMY WITH NEEDLE LOCALIZATION Right 10/11/2016   Procedure: PARTIAL MASTECTOMY WITH NEEDLE LOCALIZATION;  Surgeon: Leonie Green, MD;  Location: [facility] OR[facility];  Service: General;  Laterality: Right;  . SENTINEL NODE BIOPSY Right 10/11/2016   Procedure: SENTINEL NODE BIOPSY;  Surgeon: Leonie Green, MD;  Location: [facility] ORS;  Service: General;  Laterality: Right;    Social History   Socioeconomic History  . Marital status: Married    Spouse name: Not on file  . Number of children: Not on file  . Years of education: Not on file  . Highest education level: Not on file  Occupational History  . Not on file  Social Needs  . Financial resource strain: Not on file  . Food insecurity:    Worry: Not on file    Inability: Not on file  . Transportation needs:    Medical: Not on file    Non-medical: Not on file  Tobacco Use  . Smoking status: Never Smoker  . Smokeless tobacco: Never Used  Substance and Sexual Activity  . Alcohol use: Yes    Comment: Social  . Drug use: No  . Sexual activity: Yes  Lifestyle  . Physical activity:    Days per week: Not on file    Minutes per session: Not on file  . Stress: Not on file  Relationships  . Social connections:    Talks on phone:  Not on file    Gets together: Not on file    Attends religious service: Not on file    Active member of club or organization: Not on file    Attends meetings of clubs or organizations: Not on file    Relationship status: Not on file  . Intimate partner violence:    Fear of current or ex partner: Not on file    Emotionally abused: Not on file    Physically abused: Not on file    Forced sexual activity: Not on file  Other Topics Concern  . Not on file  Social History Narrative  . Not on file    Family History  Problem Relation Age of Onset  . Breast cancer Maternal Grandmother 66  . Cancer Maternal Grandmother   . Cancer Maternal Uncle   . Cancer Maternal Grandfather      Current Outpatient Medications:  .  anastrozole (ARIMIDEX) 1 MG tablet, Take 1 tablet (1 mg total) by mouth  daily., Disp: 30 tablet, Rfl: 6 .  CALCIUM CARBONATE-VITAMIN D PO, Take by mouth., Disp: , Rfl:  .  levothyroxine (SYNTHROID, LEVOTHROID) 112 MCG tablet, , Disp: , Rfl:  .  venlafaxine XR (EFFEXOR XR) 150 MG 24 hr capsule, Take 1 capsule (150 mg total) by mouth daily with breakfast., Disp: 30 capsule, Rfl: 3 .  exemestane (AROMASIN) 25 MG tablet, Take 1 tablet (25 mg total) by mouth daily after breakfast. (Patient not taking: Reported on 09/21/2017), Disp: 30 tablet, Rfl: 0 .  ibuprofen (ADVIL,MOTRIN) 200 MG tablet, Take 600 mg by mouth every 8 (eight) hours as needed (for pain.)., Disp: , Rfl:  .  venlafaxine (EFFEXOR) 75 MG tablet, Take 75 mg by mouth daily. , Disp: , Rfl:   Physical exam:  Vitals:   09/21/17 1456  BP: 129/84  Pulse: 83  Resp: 18  Temp: (!) 97.5 F (36.4 C)  TempSrc: Tympanic  SpO2: 100%  Weight: 159 lb 6.4 oz (72.3 kg)  Height: 5' 5" (1.651 m)   Physical Exam  Constitutional: She is oriented to person, place, and time. She appears well-developed and well-nourished.  HENT:  Head: Normocephalic and atraumatic.  Eyes: Pupils are equal, round, and reactive to light. EOM are  normal.  Neck: Normal range of motion.  Cardiovascular: Normal rate, regular rhythm and normal heart sounds.  Pulmonary/Chest: Effort normal and breath sounds normal.  Abdominal: Soft. Bowel sounds are normal.  Neurological: She is alert and oriented to person, place, and time.  Skin: Skin is warm and dry.     No flowsheet data found. No flowsheet data found.  No images are attached to the encounter.  Mm Diag Breast Tomo Bilateral  Result Date: 09/13/2017 CLINICAL DATA:  Patient presents for bilateral diagnostic examination due to history of previous right malignant lumpectomy and subsequent radiation June 2018 with subsequent re-excision July 2018. EXAM: DIGITAL DIAGNOSTIC BILATERAL MAMMOGRAM WITH CAD AND TOMO COMPARISON:  Previous exam(s). ACR Breast Density Category c: The breast tissue is heterogeneously dense, which may obscure small masses. FINDINGS: Examination demonstrates expected post lumpectomy changes over the slightly inner lower right breast. Postsurgical change over the right axilla. Remainder of the exam is unchanged. Mammographic images were processed with CAD. IMPRESSION: Expected post lumpectomy changes right breast. RECOMMENDATION: Recommend continued annual bilateral diagnostic mammographic evaluation. I have discussed the findings and recommendations with the patient. Results were also provided in writing at the conclusion of the visit. If applicable, a reminder letter will be sent to the patient regarding the next appointment. BI-RADS CATEGORY  2: Benign. Electronically Signed   By: Marin Olp M.D.   On: 09/13/2017 14:41     Assessment and plan- Patient is a 57 y.o. female with history of stage Ia invasive mammary carcinoma of the right breast status post lumpectomy and adjuvant radiation and currently on Arimidex here for routine f/u of breast cancer  Patient will continue arimidex at this time along with calcium and vit D. She will call us if her hot flashes are  getting worse again or if she has any other side effects. I will see ehr back in 6 months no labs.  Continue effexor 150 gm for hot flashes    Visit Diagnosis 1. Malignant neoplasm of lower-inner quadrant of right breast of female, estrogen receptor positive ([place])   2. Visit for monitoring Arimidex therapy   3. Hot flashes      Dr. Randa Evens, MD, MPH [facility] [phone] 09/22/2017  8:36 AM

## 2017-10-03 DIAGNOSIS — H40003 Preglaucoma, unspecified, bilateral: Secondary | ICD-10-CM | POA: Diagnosis not present

## 2018-01-04 ENCOUNTER — Telehealth: Payer: Self-pay | Admitting: *Deleted

## 2018-01-04 NOTE — Telephone Encounter (Signed)
Pt called and left message to say that she is having a lot of joint pain as well as hurting in hips and then she thought maybe if she walked it would work out the pain but she walked several day 1-2 miles and she said she felt worse. It takes all she has to work a day at work. She feels like it is coming from exemestane. I told her that I will speak to Dr. Janese Banks and I will call her back. I told her to stop the exemestane and Iwill call her back tom. With what is the nest step.

## 2018-01-04 NOTE — Telephone Encounter (Signed)
The med she was on was armidex. Dr. Janese Banks said to stop it for 3-4 weeks and then have pt. Call me back in that period of time between 3-4 weeks and see if she is feeling better. Then we can send rx in for femara (letrozole) . Pt is agreeable to the plan and she has my direct number

## 2018-01-25 ENCOUNTER — Other Ambulatory Visit: Payer: Self-pay | Admitting: *Deleted

## 2018-01-25 MED ORDER — VENLAFAXINE HCL ER 150 MG PO CP24: 150.0000 mg | ORAL_CAPSULE | Freq: Every day | ORAL | 1 refills | Status: DC

## 2018-02-05 ENCOUNTER — Telehealth: Payer: Self-pay | Admitting: *Deleted

## 2018-02-05 NOTE — Telephone Encounter (Signed)
Pt called to let me know that she feels a little better but both hips still hurting her.  She ahs developed 2 spots that are itching her and she has no bite or irritation on the skin but describes it as deep within-it is the front of her shoulders and going to the breasts-bilaterally. She has tried benadryl pills, cream, gel, cortisone cream and another cream that I have not heard of.  She is not sure if she should start on the other med or not. I told her that I would call her back tom. After asking Dr. Janese Banks .

## 2018-02-06 NOTE — Telephone Encounter (Signed)
Called pt and let her know that Dr. Janese Banks said to call PCP about itching and to stay of arimidex for 2 more weeks to see if her hips get any  Better. She is agreeable to plan

## 2018-02-06 NOTE — Telephone Encounter (Signed)
She needs to touch base with pcp regarding her rash. Unlikely to be related to arimidex

## 2018-02-14 DIAGNOSIS — Z79899 Other long term (current) drug therapy: Secondary | ICD-10-CM | POA: Diagnosis not present

## 2018-02-14 DIAGNOSIS — E039 Hypothyroidism, unspecified: Secondary | ICD-10-CM | POA: Diagnosis not present

## 2018-02-21 ENCOUNTER — Encounter: Payer: Self-pay | Admitting: Radiation Oncology

## 2018-02-21 ENCOUNTER — Other Ambulatory Visit: Payer: Self-pay

## 2018-02-21 ENCOUNTER — Ambulatory Visit
Admission: RE | Admit: 2018-02-21 | Discharge: 2018-02-21 | Disposition: A | Discharge status: Home / Self Care | Payer: 59 | Source: Ambulatory Visit | Attending: Radiation Oncology | Admitting: Radiation Oncology

## 2018-02-21 DIAGNOSIS — C50311 Malignant neoplasm of lower-inner quadrant of right female breast: Secondary | ICD-10-CM

## 2018-02-21 DIAGNOSIS — Z853 Personal history of malignant neoplasm of breast: Secondary | ICD-10-CM | POA: Diagnosis not present

## 2018-02-21 DIAGNOSIS — Z923 Personal history of irradiation: Secondary | ICD-10-CM | POA: Diagnosis not present

## 2018-02-21 DIAGNOSIS — Z170 Estrogen receptor positive status [ER+]: Secondary | ICD-10-CM

## 2018-02-21 NOTE — Progress Notes (Signed)
Radiation Oncology Follow up Note  Name: Sandy Petty   Date:   02/21/2018 MRN:  [id number] DOB: 07-19-1960    This 57 y.o. female presents to the clinic today for 1 year follow-up status post whole breast radiation to her right breast for ER/PR positive invasive mammary carcinoma stage I.  REFERRING PROVIDER: Juluis Pitch, MD  HPI: patient is a 57 year old female now seen out 1 year having completed whole breast radiation to her right breast for ER/PR positive invasive mammary carcinoma stage I. Seen today in routine follow up she is doing well. She specifically denies breast tenderness cough or bone pain. She's been taken off her.arimadex secondary to muscle soreness.she had mammograms back in June which I have reviewed were BI-RADS 2 benign.  COMPLICATIONS OF TREATMENT: none  FOLLOW UP COMPLIANCE: keeps appointments   PHYSICAL EXAM:  BP (P) 119/83 (BP Location: Left Arm, Patient Position: Sitting)   Pulse (P) 82   Temp (!) (P) 96.9 F (36.1 C) (Tympanic)   Wt (P) 163 lb 11.1 oz (74.2 kg)   BMI (P) 27.24 kg/m  Lungs are clear to A&P cardiac examination essentially unremarkable with regular rate and rhythm. No dominant mass or nodularity is noted in either breast in 2 positions examined. Incision is well-healed. No axillary or supraclavicular adenopathy is appreciated. Cosmetic result is excellent.Well-developed well-nourished patient in NAD. HEENT reveals PERLA, EOMI, discs not visualized.  Oral cavity is clear. No oral mucosal lesions are identified. Neck is clear without evidence of cervical or supraclavicular adenopathy. Lungs are clear to A&P. Cardiac examination is essentially unremarkable with regular rate and rhythm without murmur rub or thrill. Abdomen is benign with no organomegaly or masses noted. Motor sensory and DTR levels are equal and symmetric in the upper and lower extremities. Cranial nerves II through XII are grossly intact. Proprioception is intact. No peripheral  adenopathy or edema is identified. No motor or sensory levels are noted. Crude visual fields are within normal range.  RADIOLOGY RESULTS: mammograms reviewed and compatible with the above-stated findings  PLAN: present time patient is doing well with no evidence of disease. I've asked to see her back in 1 year for follow-up. Patient is to call sooner with any concerns.  I would like to take this opportunity to thank you for allowing me to participate in the care of your patient.Noreene Filbert, MD

## 2018-03-07 ENCOUNTER — Telehealth: Payer: Self-pay | Admitting: *Deleted

## 2018-03-07 MED ORDER — LETROZOLE 2.5 MG PO TABS: 2.5000 mg | ORAL_TABLET | Freq: Every day | ORAL | 0 refills | Status: DC

## 2018-03-07 NOTE — Telephone Encounter (Signed)
Patient had called and said that she was having hip pain and thought that exercise would help and was walking around track several time a week and it was not helping and Dr. Janese Banks had said that she can come off the arimidex and see how she feels. She has been off the armidex for 2 weeks and she states she still has hip pain but it does not hurt as bad. Wants to know what to do now. I told her that I would call her with response after  I check with MD . She was agreeable

## 2018-03-07 NOTE — Telephone Encounter (Signed)
Dr. Janese Banks said to try the patient on letrozole.  I called patient and let her know that Dr. Janese Banks recommended to start a new medication called letrozole instead of the Arimidex that she is been off of for the last 2 weeks.  I explained to the patient that the side effects for both drugs are very similar hot flashes arthralgias are the majority of side effects that can happen.  I told her that usually between 1 of the 3 drugs one is more tolerable than another.  He just have to try med and see if it is going to work.  Patient agreeable to try it.  [facility]  Patient is coming back to see [place] on December 20 and will let us know at that time if the medication is better or not for her.

## 2018-03-15 DIAGNOSIS — L299 Pruritus, unspecified: Secondary | ICD-10-CM | POA: Diagnosis not present

## 2018-03-15 DIAGNOSIS — E039 Hypothyroidism, unspecified: Secondary | ICD-10-CM | POA: Diagnosis not present

## 2018-03-23 ENCOUNTER — Inpatient Hospital Stay: Payer: 59 | Attending: Oncology | Admitting: Oncology

## 2018-03-23 ENCOUNTER — Other Ambulatory Visit: Payer: Self-pay

## 2018-03-23 VITALS — BP 129/85 | HR 80 | Temp 98.3°F | Ht 65.0 in | Wt 158.0 lb

## 2018-03-23 DIAGNOSIS — Z170 Estrogen receptor positive status [ER+]: Secondary | ICD-10-CM | POA: Insufficient documentation

## 2018-03-23 DIAGNOSIS — C50911 Malignant neoplasm of unspecified site of right female breast: Secondary | ICD-10-CM | POA: Diagnosis not present

## 2018-03-23 DIAGNOSIS — Z08 Encounter for follow-up examination after completed treatment for malignant neoplasm: Secondary | ICD-10-CM

## 2018-03-23 DIAGNOSIS — Z853 Personal history of malignant neoplasm of breast: Secondary | ICD-10-CM

## 2018-03-23 DIAGNOSIS — Z79811 Long term (current) use of aromatase inhibitors: Secondary | ICD-10-CM | POA: Diagnosis not present

## 2018-03-23 MED ORDER — EXEMESTANE 25 MG PO TABS: 25.0000 mg | ORAL_TABLET | Freq: Every day | ORAL | 5 refills | Status: DC

## 2018-03-26 ENCOUNTER — Encounter: Payer: Self-pay | Admitting: Oncology

## 2018-03-26 NOTE — Progress Notes (Signed)
Hematology/Oncology Consult note [facility]  Telephone:([phone] [phone]  Patient Care Team: Derinda Late, MD as PCP - General (Family Medicine)   Name of the patient: Sandy Petty  [id number]  Aug 05, 1960   Date of visit: 03/26/18  Diagnosis-  Invasive mammary carcinoma of right breast pathological prognostic stage IA [id number] ER positive, PR positive and her 2 neu negative   Chief complaint/ Reason for visit-routine follow-up of breast cancer on Aromasin  Heme/Onc history: 1. Patient is a 57 year old female who recently had a normal bilateral screening mammogram on 09/05/2016 which showed calcifications in her right breast warranting further evaluation. This was followed by a diagnostic right breast mammogram which showed 5 x 3 x 2 mm or calcification in the right breast lower inner quadrant. No associated mass.  2. She had stereotactic biopsy of this lesion which showed invasive mammary carcinomato type. 3 mm, grade 2 ER greater than 90% positive, PR 1-10% positive and HER-2/neu negative   3. Patient has been seen by Dr. Tamala Julian and is scheduled to undergo surgery on 10/11/16  4. Patient has been getting mammograms since the age of 70 and has not had any prior abnormal mammograms. She is G1 P1 L1. She used birth control pills for a long time and then switched to IUD which she has had for 10 years and recently had taken out. She has not had any menstrual cycles since the insertion of IUD.Hormone levels were checked post IUD removal and were in the post menopausal range.Family history significant for breast cancer in her maternal grandmother died in her 50s and pancreatic cancer in her maternal uncle.  5. DIAGNOSIS:  A. RIGHT BREAST MASS; NEEDLE LOCALIZED EXCISION:  - INVASIVE MAMMARY CARCINOMA OF NO SPECIAL TYPE.  - THE INFERIOR MARGIN RESECTION IS POSITIVE.  - BIOPSY SITE CHANGES, MARKER CLIP PRESENT.  - HEMATOMA.  - SEE SUMMARY  BELOW.   B. SENTINEL LYMPH NODE; EXCISION:  - NO TUMOR SEEN IN ONE LYMPH NODE (0/1).   She did have re excision of positive inferior margin which did not reveal any residual tumor.  Size of tumor on final path was less than 5 mm and therefore Oncotype testing was not done and she did not require any adjuvant chemotherapy. She did complete her adjuvant radiation and was started on Arimidex.  She could not tolerate that due to hot flashes and was eventually switched to Aromasin. Baseline bone density scan showed osteopenia  Interval history-she is tolerating Aromasin well along with calcium and vitamin D  ECOG PS- 0 Pain scale- 0 Opioid associated constipation- no  Review of systems- Review of Systems  Constitutional: Negative for chills, fever, malaise/fatigue and weight loss.  HENT: Negative for congestion, ear discharge and nosebleeds.   Eyes: Negative for blurred vision.  Respiratory: Negative for cough, hemoptysis, sputum production, shortness of breath and wheezing.   Cardiovascular: Negative for chest pain, palpitations, orthopnea and claudication.  Gastrointestinal: Negative for abdominal pain, blood in stool, constipation, diarrhea, heartburn, melena, nausea and vomiting.  Genitourinary: Negative for dysuria, flank pain, frequency, hematuria and urgency.  Musculoskeletal: Negative for back pain, joint pain and myalgias.  Skin: Negative for rash.  Neurological: Negative for dizziness, tingling, focal weakness, seizures, weakness and headaches.  Endo/Heme/Allergies: Does not bruise/bleed easily.  Psychiatric/Behavioral: Negative for depression and suicidal ideas. The patient does not have insomnia.    Breast exam was performed in seated and lying down position. Patient is status post right lumpectomy with a  well-healed surgical scar. No evidence of any palpable masses. No evidence of axillary adenopathy. No evidence of any palpable masses or lumps in the left breast. No  evidence of leftt axillary adenopathy    Allergies  Allergen Reactions  . Meloxicam Rash  . Sulfa Antibiotics Rash     Past Medical History:  Diagnosis Date  . Anxiety   . Cancer ([place]) 10/2016   Right breast  . Complication of anesthesia   . Hypothyroidism   . Personal history of radiation therapy 2018   Breast  . PONV (postoperative nausea and vomiting)    AFTER BREAST SURGERY 10-2016     Past Surgical History:  Procedure Laterality Date  . BACK SURGERY     NECK SURGERY-PLATE WITH 4 SCREWS  . BREAST BIOPSY Right 09/19/2016   invasasive breast ca  . BREAST EXCISIONAL BIOPSY Right 10/11/2016   lumpectomy  . CARPAL TUNNEL RELEASE    . COLONOSCOPY  2015  . ELBOW SURGERY     X2  . MASTECTOMY, PARTIAL Right 11/04/2016   Procedure: RE-EXCISION INFERIOR MARGIN RIGHT BREAST;  Surgeon: Leonie Green, MD;  Location: [facility] OR[facility];  Service: General;  Laterality: Right;  . PARTIAL MASTECTOMY WITH NEEDLE LOCALIZATION Right 10/11/2016   Procedure: PARTIAL MASTECTOMY WITH NEEDLE LOCALIZATION;  Surgeon: Leonie Green, MD;  Location: [facility];  Service: General;  Laterality: Right;  . SENTINEL NODE BIOPSY Right 10/11/2016   Procedure: SENTINEL NODE BIOPSY;  Surgeon: Leonie Green, MD;  Location: [facility];  Service: General;  Laterality: Right;    Social History   Socioeconomic History  . Marital status: Married    Spouse name: Not on file  . Number of children: Not on file  . Years of education: Not on file  . Highest education level: Not on file  Occupational History  . Not on file  Social Needs  . Financial resource strain: Not on file  . Food insecurity:    Worry: Not on file    Inability: Not on file  . Transportation needs:    Medical: Not on file    Non-medical: Not on file  Tobacco Use  . Smoking status: Never Smoker  . Smokeless tobacco: Never Used  Substance and Sexual Activity  . Alcohol use: Yes    Comment: Social  . Drug use: No  . Sexual  activity: Yes  Lifestyle  . Physical activity:    Days per week: Not on file    Minutes per session: Not on file  . Stress: Not on file  Relationships  . Social connections:    Talks on phone: Not on file    Gets together: Not on file    Attends religious service: Not on file    Active member of club or organization: Not on file    Attends meetings of clubs or organizations: Not on file    Relationship status: Not on file  . Intimate partner violence:    Fear of current or ex partner: Not on file    Emotionally abused: Not on file    Physically abused: Not on file    Forced sexual activity: Not on file  Other Topics Concern  . Not on file  Social History Narrative  . Not on file    Family History  Problem Relation Age of Onset  . Breast cancer Maternal Grandmother 38  . Cancer Maternal Grandmother   . Cancer Maternal Uncle   . Cancer Maternal Grandfather  Current Outpatient Medications:  .  CALCIUM CARBONATE-VITAMIN D PO, Take by mouth., Disp: , Rfl:  .  exemestane (AROMASIN) 25 MG tablet, Take 1 tablet (25 mg total) by mouth daily after breakfast., Disp: 30 tablet, Rfl: 5 .  levothyroxine (SYNTHROID, LEVOTHROID) 112 MCG tablet, , Disp: , Rfl:  .  venlafaxine XR (EFFEXOR XR) 150 MG 24 hr capsule, Take 1 capsule (150 mg total) by mouth daily with breakfast., Disp: 90 capsule, Rfl: 1 .  ibuprofen (ADVIL,MOTRIN) 200 MG tablet, Take 600 mg by mouth every 8 (eight) hours as needed (for pain.)., Disp: , Rfl:   Physical exam:  Vitals:   03/23/18 1437  BP: 129/85  Pulse: 80  Temp: 98.3 F (36.8 C)  TempSrc: Tympanic  Weight: 158 lb (71.7 kg)  Height: '5\' 5"'  (1.651 m)   Physical Exam HENT:     Head: Normocephalic and atraumatic.  Eyes:     Pupils: Pupils are equal, round, and reactive to light.  Neck:     Musculoskeletal: Normal range of motion.  Cardiovascular:     Rate and Rhythm: Normal rate and regular rhythm.     Heart sounds: Normal heart sounds.    Pulmonary:     Effort: Pulmonary effort is normal.     Breath sounds: Normal breath sounds.  Abdominal:     General: Bowel sounds are normal.     Palpations: Abdomen is soft.  Skin:    General: Skin is warm and dry.  Neurological:     Mental Status: She is alert and oriented to person, place, and time.        Assessment and plan- Patient is a 57 y.o. female with history of stage Ia invasive mammary carcinoma of the right breast status post lumpectomy and adjuvant radiation.  She is currently on Aromasin and here for routine follow-up of her breast cancer  Clinically patient is doing well and there is no evidence of recurrence on today's exam.  She will continue Aromasin along with calcium and vitamin D.  I will see her back in 6 months no labs   Visit Diagnosis 1. Encounter for follow-up surveillance of breast cancer      Dr. Randa Evens, MD, MPH [facility] [phone] 03/26/2018 2:04 PM

## 2018-05-28 ENCOUNTER — Other Ambulatory Visit: Payer: Self-pay | Admitting: Oncology

## 2018-09-24 ENCOUNTER — Other Ambulatory Visit: Payer: Self-pay | Admitting: Obstetrics

## 2018-09-24 DIAGNOSIS — R928 Other abnormal and inconclusive findings on diagnostic imaging of breast: Secondary | ICD-10-CM

## 2018-09-28 ENCOUNTER — Ambulatory Visit: Payer: 59 | Admitting: Oncology

## 2018-10-19 ENCOUNTER — Encounter: Payer: Self-pay | Admitting: Oncology

## 2018-10-19 ENCOUNTER — Inpatient Hospital Stay: Payer: Managed Care, Other (non HMO) | Attending: Oncology | Admitting: Oncology

## 2018-10-19 ENCOUNTER — Other Ambulatory Visit: Payer: Self-pay

## 2018-10-19 VITALS — BP 132/85 | HR 89 | Temp 97.6°F | Resp 18 | Ht 65.0 in | Wt 158.6 lb

## 2018-10-19 DIAGNOSIS — Z08 Encounter for follow-up examination after completed treatment for malignant neoplasm: Secondary | ICD-10-CM

## 2018-10-19 DIAGNOSIS — Z170 Estrogen receptor positive status [ER+]: Secondary | ICD-10-CM | POA: Insufficient documentation

## 2018-10-19 DIAGNOSIS — Z79811 Long term (current) use of aromatase inhibitors: Secondary | ICD-10-CM | POA: Insufficient documentation

## 2018-10-19 DIAGNOSIS — C50311 Malignant neoplasm of lower-inner quadrant of right female breast: Secondary | ICD-10-CM | POA: Diagnosis present

## 2018-10-19 DIAGNOSIS — Z853 Personal history of malignant neoplasm of breast: Secondary | ICD-10-CM

## 2018-10-19 NOTE — Progress Notes (Signed)
No breast concerns °

## 2018-10-19 NOTE — Progress Notes (Signed)
Hematology/Oncology Consult note [facility]  Telephone:([phone] [phone]  Patient Care Team: Derinda Late, MD as PCP - General (Family Medicine)   Name of the patient: Sandy Petty  [id number]  10-Mar-1961   Date of visit: 10/19/18  Diagnosis- Invasive mammary carcinoma of right breast pathological prognostic stage IA [id number] ER positive, PR positive and her 2 neu negative   Chief complaint/ Reason for visit-routine follow-up of breast cancer on Arimidex  Heme/Onc history: 1. Patient is a 58 year old female who recently had a normal bilateral screening mammogram on 09/05/2016 which showed calcifications in her right breast warranting further evaluation. This was followed by a diagnostic right breast mammogram which showed 5 x 3 x 2 mm or calcification in the right breast lower inner quadrant. No associated mass.  2. She had stereotactic biopsy of this lesion which showed invasive mammary carcinomato type. 3 mm, grade 2 ER greater than 90% positive, PR 1-10% positive and HER-2/neu negative   3. Patient has been seen by Dr. Tamala Julian and is scheduled to undergo surgery on 10/11/16  4. Patient has been getting mammograms since the age of 55 and has not had any prior abnormal mammograms. She is G1 P1 L1. She used birth control pills for a long time and then switched to IUD which she has had for 10 years and recently had taken out. She has not had any menstrual cycles since the insertion of IUD.Hormone levels were checked post IUD removal and were in the post menopausal range.Family history significant for breast cancer in her maternal grandmother died in her 31s and pancreatic cancer in her maternal uncle.  5. DIAGNOSIS:  A. RIGHT BREAST MASS; NEEDLE LOCALIZED EXCISION:  - INVASIVE MAMMARY CARCINOMA OF NO SPECIAL TYPE.  - THE INFERIOR MARGIN RESECTION IS POSITIVE.  - BIOPSY SITE CHANGES, MARKER CLIP PRESENT.  - HEMATOMA.  - SEE SUMMARY  BELOW.   B. SENTINEL LYMPH NODE; EXCISION:  - NO TUMOR SEEN IN ONE LYMPH NODE (0/1).   She did have re excision of positive inferior margin which did not reveal any residual tumor.  Size of tumor on final path was less than 5 mm and therefore Oncotype testing was not done and she did not require any adjuvant chemotherapy. She did complete her adjuvant radiation and was started on Arimidex. Baseline bone density scan showed osteopenia   Interval history-patient is overall tolerating her Arimidex well and reports no significant side effects at this time.  She denies any significant fatigue.  She has mild joint pain which is self-limited and does not affect her mobility.  ECOG PS- 0 Pain scale- 0   Review of systems- Review of Systems  Constitutional: Negative for chills, fever, malaise/fatigue and weight loss.  HENT: Negative for congestion, ear discharge and nosebleeds.   Eyes: Negative for blurred vision.  Respiratory: Negative for cough, hemoptysis, sputum production, shortness of breath and wheezing.   Cardiovascular: Negative for chest pain, palpitations, orthopnea and claudication.  Gastrointestinal: Negative for abdominal pain, blood in stool, constipation, diarrhea, heartburn, melena, nausea and vomiting.  Genitourinary: Negative for dysuria, flank pain, frequency, hematuria and urgency.  Musculoskeletal: Negative for back pain, joint pain and myalgias.  Skin: Negative for rash.  Neurological: Negative for dizziness, tingling, focal weakness, seizures, weakness and headaches.  Endo/Heme/Allergies: Does not bruise/bleed easily.  Psychiatric/Behavioral: Negative for depression and suicidal ideas. The patient does not have insomnia.        Allergies  Allergen Reactions  . Meloxicam  Rash  . Sulfa Antibiotics Rash     Past Medical History:  Diagnosis Date  . Anxiety   . Cancer ([place]) 10/2016   Right breast  . Complication of anesthesia   . Hypothyroidism   .  Personal history of radiation therapy 2018   Breast  . PONV (postoperative nausea and vomiting)    AFTER BREAST SURGERY 10-2016     Past Surgical History:  Procedure Laterality Date  . BACK SURGERY     NECK SURGERY-PLATE WITH 4 SCREWS  . BREAST BIOPSY Right 09/19/2016   invasasive breast ca  . BREAST EXCISIONAL BIOPSY Right 10/11/2016   lumpectomy  . CARPAL TUNNEL RELEASE    . COLONOSCOPY  2015  . ELBOW SURGERY     X2  . MASTECTOMY, PARTIAL Right 11/04/2016   Procedure: RE-EXCISION INFERIOR MARGIN RIGHT BREAST;  Surgeon: Leonie Green, MD;  Location: [facility] ORS;  Service: General;  Laterality: Right;  . PARTIAL MASTECTOMY WITH NEEDLE LOCALIZATION Right 10/11/2016   Procedure: PARTIAL MASTECTOMY WITH NEEDLE LOCALIZATION;  Surgeon: Leonie Green, MD;  Location: [facility];  Service: General;  Laterality: Right;  . SENTINEL NODE BIOPSY Right 10/11/2016   Procedure: SENTINEL NODE BIOPSY;  Surgeon: Leonie Green, MD;  Location: [facility];  Service: General;  Laterality: Right;    Social History   Socioeconomic History  . Marital status: Married    Spouse name: Not on file  . Number of children: Not on file  . Years of education: Not on file  . Highest education level: Not on file  Occupational History  . Not on file  Social Needs  . Financial resource strain: Not on file  . Food insecurity    Worry: Not on file    Inability: Not on file  . Transportation needs    Medical: Not on file    Non-medical: Not on file  Tobacco Use  . Smoking status: Never Smoker  . Smokeless tobacco: Never Used  Substance and Sexual Activity  . Alcohol use: Yes    Comment: Social  . Drug use: No  . Sexual activity: Yes  Lifestyle  . Physical activity    Days per week: Not on file    Minutes per session: Not on file  . Stress: Not on file  Relationships  . Social Herbalist on phone: Not on file    Gets together: Not on file    Attends religious service: Not on  file    Active member of club or organization: Not on file    Attends meetings of clubs or organizations: Not on file    Relationship status: Not on file  . Intimate partner violence    Fear of current or ex partner: Not on file    Emotionally abused: Not on file    Physically abused: Not on file    Forced sexual activity: Not on file  Other Topics Concern  . Not on file  Social History Narrative  . Not on file    Family History  Problem Relation Age of Onset  . Breast cancer Maternal Grandmother 51  . Cancer Maternal Grandmother   . Cancer Maternal Uncle   . Cancer Maternal Grandfather      Current Outpatient Medications:  .  CALCIUM CARBONATE-VITAMIN D PO, Take by mouth., Disp: , Rfl:  .  exemestane (AROMASIN) 25 MG tablet, Take 1 tablet (25 mg total) by mouth daily after breakfast., Disp: 30 tablet, Rfl: 5 .  ibuprofen (ADVIL,MOTRIN) 200 MG tablet, Take 600 mg by mouth every 8 (eight) hours as needed (for pain.)., Disp: , Rfl:  .  levothyroxine (SYNTHROID, LEVOTHROID) 112 MCG tablet, , Disp: , Rfl:  .  venlafaxine XR (EFFEXOR-XR) 150 MG 24 hr capsule, TAKE 1 CAPSULE BY MOUTH  DAILY WITH BREAKFAST, Disp: 90 capsule, Rfl: 1  Physical exam:  Vitals:   10/19/18 1456  BP: 132/85  Pulse: 89  Resp: 18  Temp: 97.6 F (36.4 C)  TempSrc: Tympanic  Weight: 158 lb 9.6 oz (71.9 kg)  Height: _0  (1.651 m)   Physical Exam HENT:     Head: Normocephalic and atraumatic.  Eyes:     Pupils: Pupils are equal, round, and reactive to light.  Neck:     Musculoskeletal: Normal range of motion.  Cardiovascular:     Rate and Rhythm: Normal rate and regular rhythm.     Heart sounds: Normal heart sounds.  Pulmonary:     Effort: Pulmonary effort is normal.     Breath sounds: Normal breath sounds.  Abdominal:     General: Bowel sounds are normal.     Palpations: Abdomen is soft.  Skin:    General: Skin is warm and dry.  Neurological:     Mental Status: She is alert and oriented to  person, place, and time.    Breast exam was performed in seated and lying down position. Patient is status post right lumpectomy with a well-healed surgical scar. No evidence of any palpable masses. No evidence of axillary adenopathy. No evidence of any palpable masses or lumps in the left breast. No evidence of leftt axillary adenopathy   Assessment and plan- Patient is a 58 y.o. female with history of stage Ia invasive mammary carcinoma of the right breast status post lumpectomy and adjuvant radiation.    Clinically patient is doing well and there is no concern for recurrence based on today's exam as well as signs and symptoms.  She is due for a repeat mammogram in July 2020.  Patient will be due for repeat bone density scan in September 2020.  I will see her back in 6 months no labs   Visit Diagnosis 1. Encounter for follow-up surveillance of breast cancer   2. Aromatase inhibitor use      Dr. Randa Evens, MD, MPH [facility] [phone] 10/19/2018 2:55 PM

## 2018-10-29 ENCOUNTER — Ambulatory Visit
Admission: RE | Admit: 2018-10-29 | Discharge: 2018-10-29 | Disposition: A | Discharge status: Home / Self Care | Payer: Managed Care, Other (non HMO) | Source: Ambulatory Visit | Attending: Obstetrics | Admitting: Obstetrics

## 2018-10-29 ENCOUNTER — Other Ambulatory Visit: Payer: Self-pay

## 2018-10-29 DIAGNOSIS — R928 Other abnormal and inconclusive findings on diagnostic imaging of breast: Secondary | ICD-10-CM

## 2018-11-05 ENCOUNTER — Other Ambulatory Visit: Payer: Self-pay | Admitting: Oncology

## 2018-11-14 ENCOUNTER — Other Ambulatory Visit: Payer: Self-pay | Admitting: Oncology

## 2018-12-17 IMAGING — MG DIGITAL SCREENING BILATERAL MAMMOGRAM WITH CAD
6 series · 6 of 6 positions shown · non-contrast
Comparison: Previous exam(s).

CLINICAL DATA: Screening.

EXAM:
DIGITAL SCREENING BILATERAL MAMMOGRAM WITH CAD

[R MLO (1 of 3)]
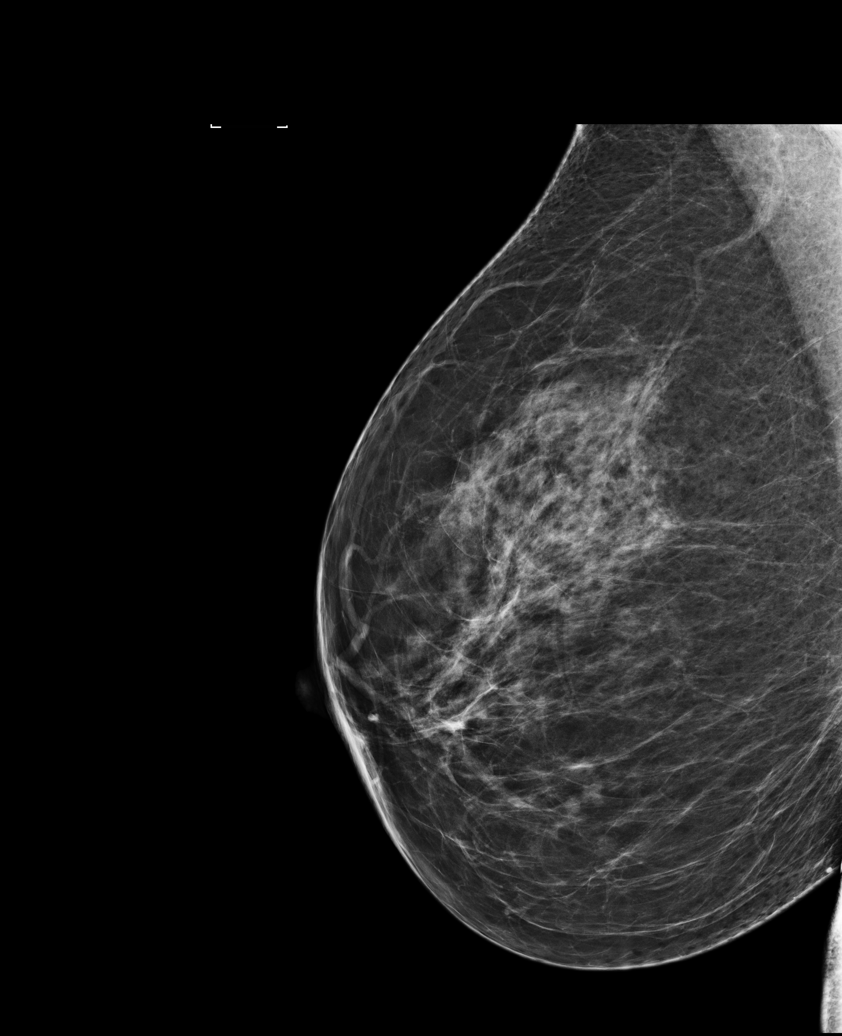

[R CC (1 of 2)]
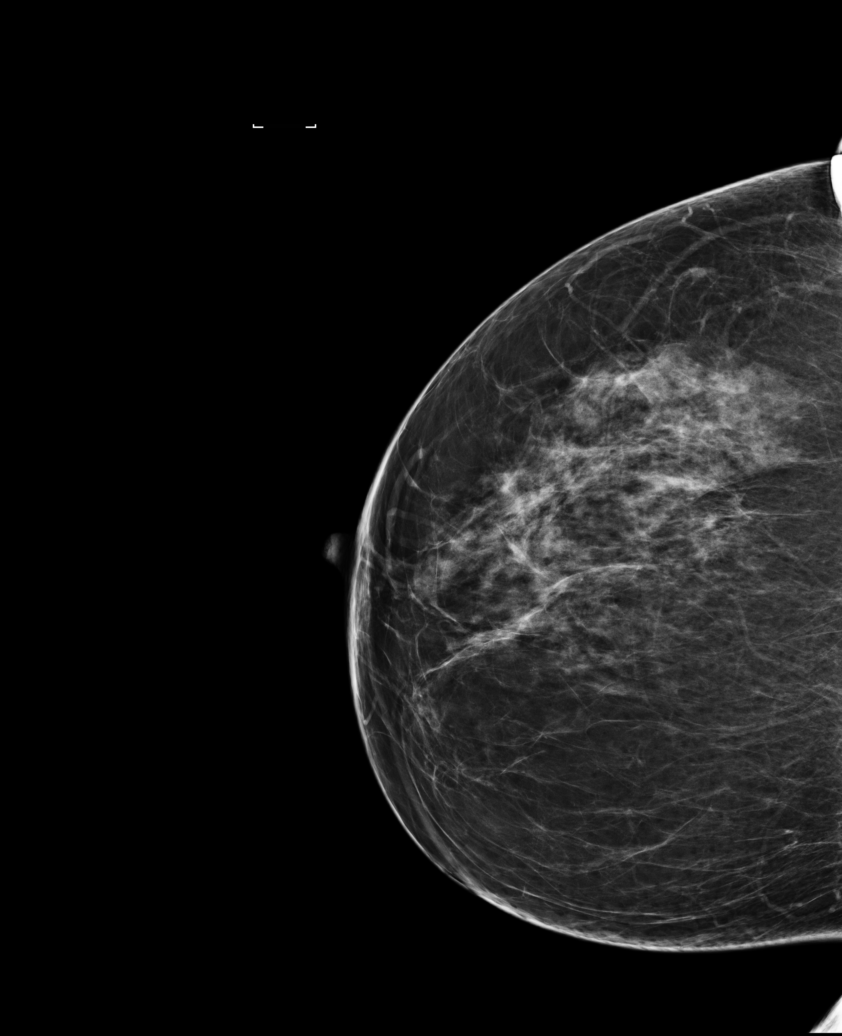

[L MLO]
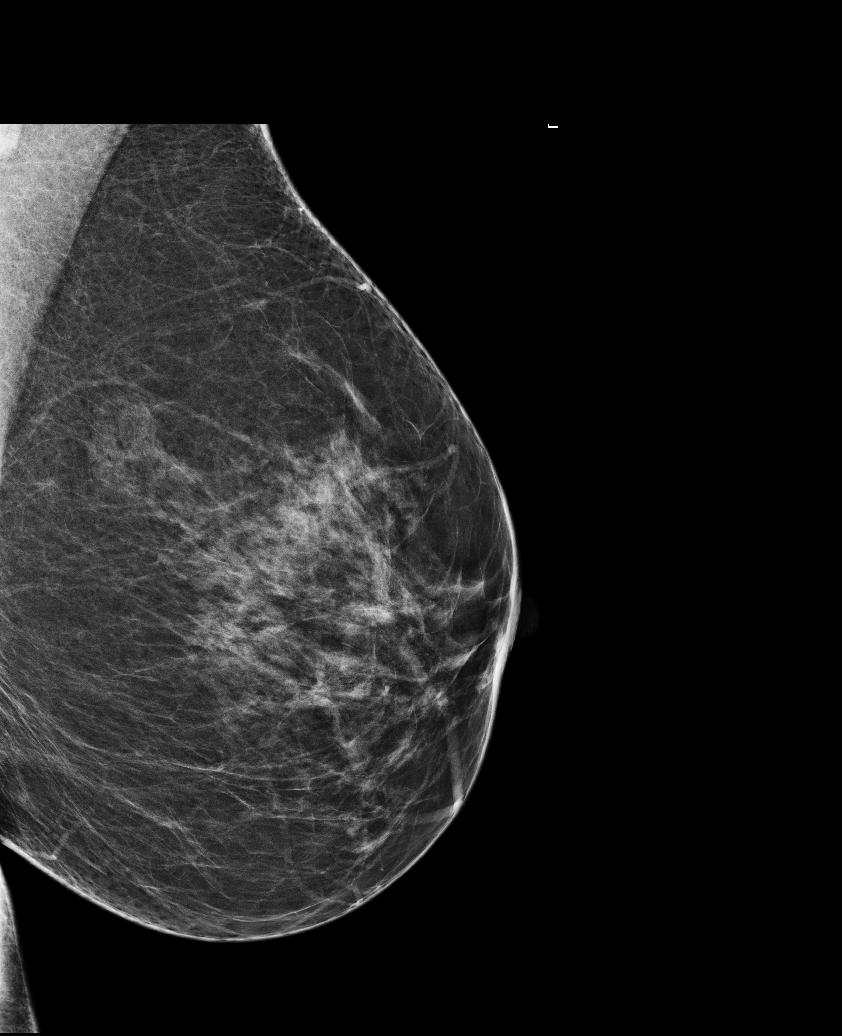

[R MLO (2 of 3)]
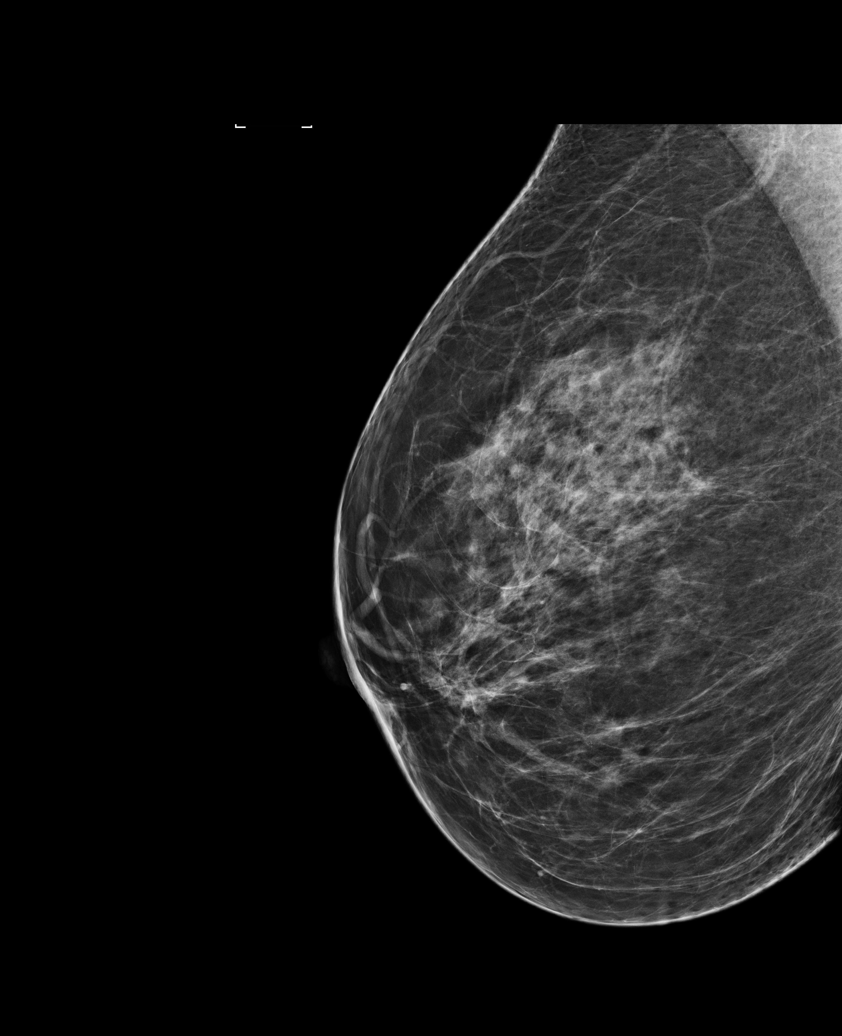

[R MLO (3 of 3)]
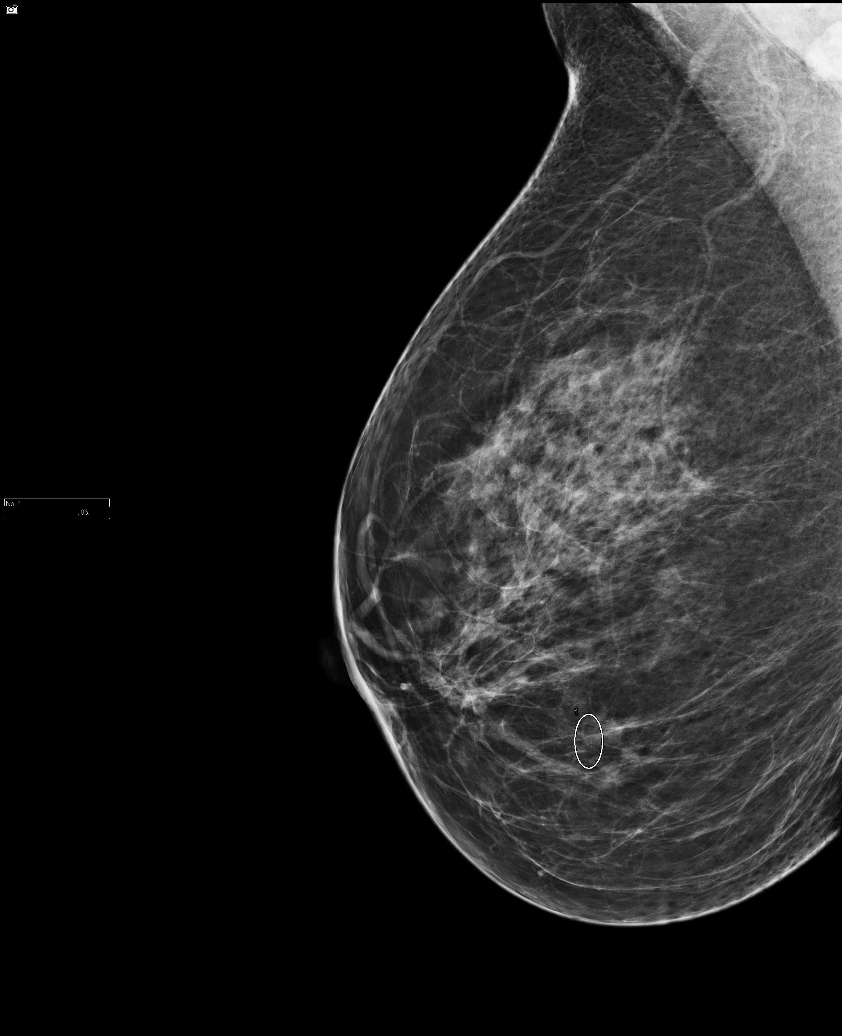

[R CC (2 of 2)]
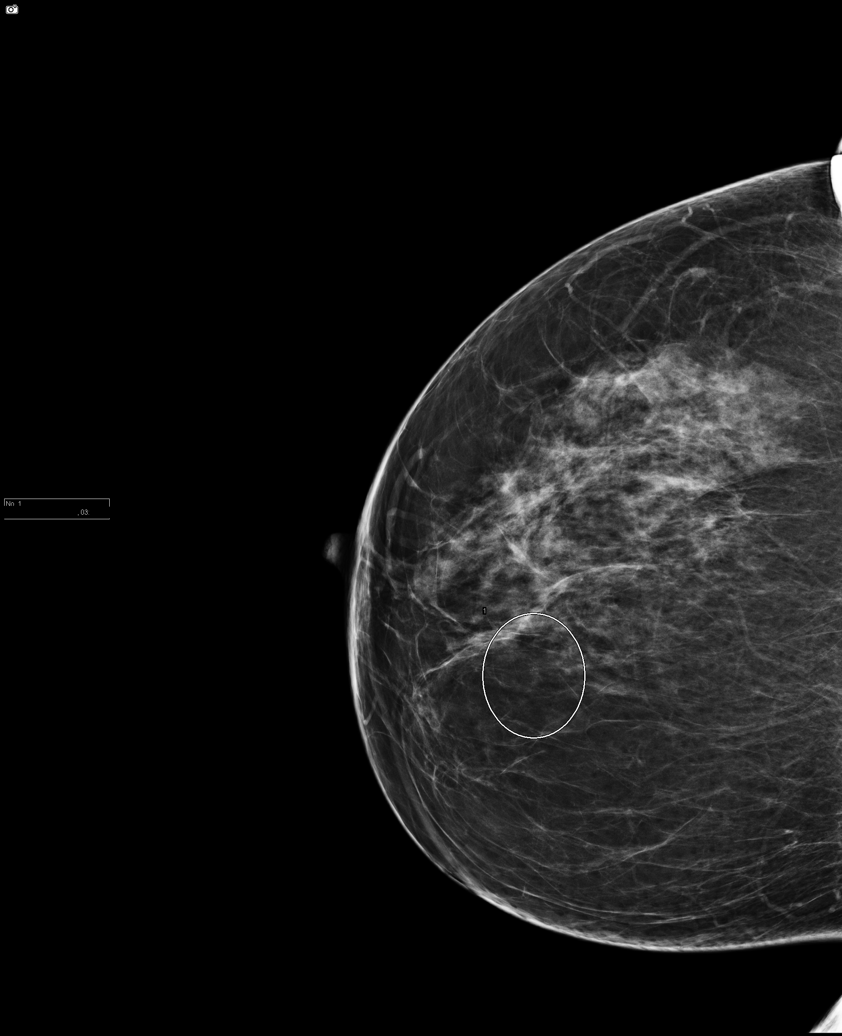

[6 of 6 positions shown; findings below may reference images not displayed]

ACR Breast Density Category b: There are scattered areas of
fibroglandular density.
FINDINGS: In the right breast, calcifications warrant further evaluation with
magnified views. In the left breast, no findings suspicious for
malignancy. Images were processed with CAD.
IMPRESSION: Further evaluation is suggested for calcifications in the right
breast.

RECOMMENDATION:
Diagnostic mammogram of the right breast. (Code:MF-[id number])

The patient will be contacted regarding the findings, and additional
imaging will be scheduled.

BI-RADS CATEGORY  0: Incomplete. Need additional imaging evaluation
and/or prior mammograms for comparison.

## 2018-12-31 IMAGING — MG MM CLIP PLACEMENT
2 series · 2 of 2 positions shown · non-contrast
Comparison: Previous exam(s).

CLINICAL DATA: Status post stereotactic biopsy today for right
breast calcifications.

EXAM:
DIAGNOSTIC RIGHT MAMMOGRAM POST STEREOTACTIC BIOPSY

[R ML]
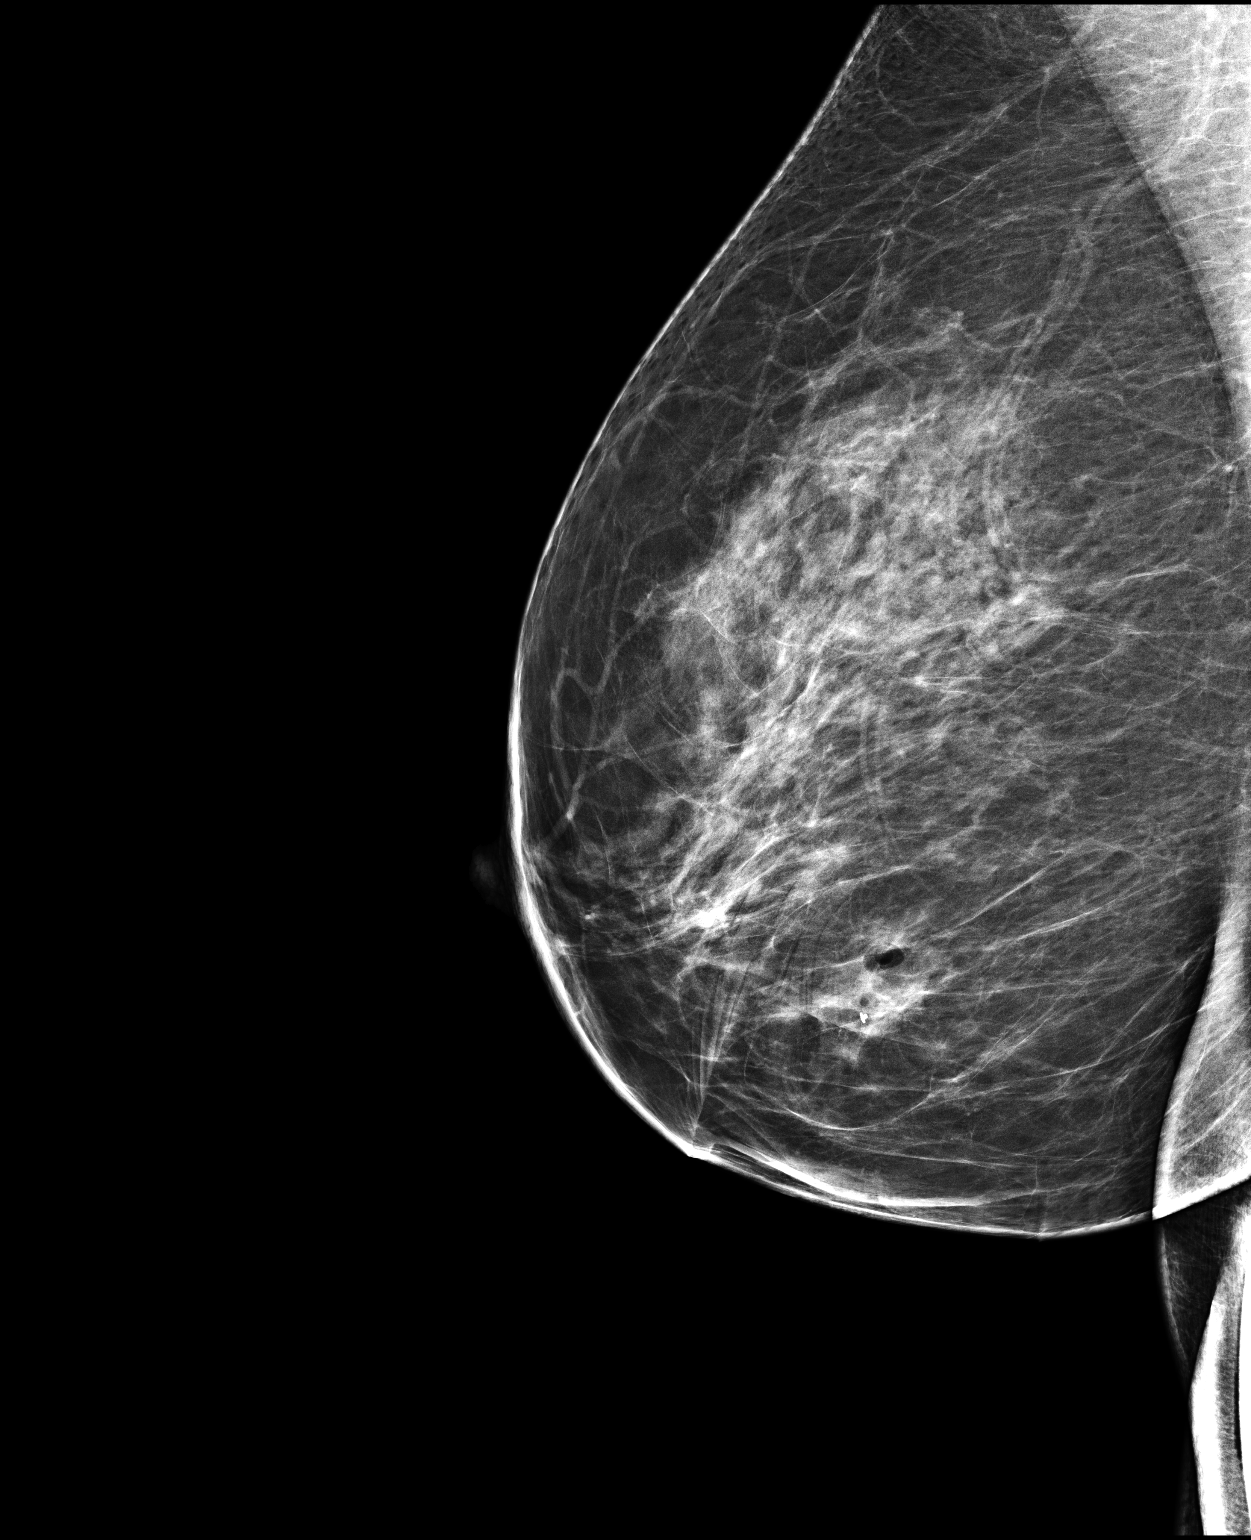

[R CC]
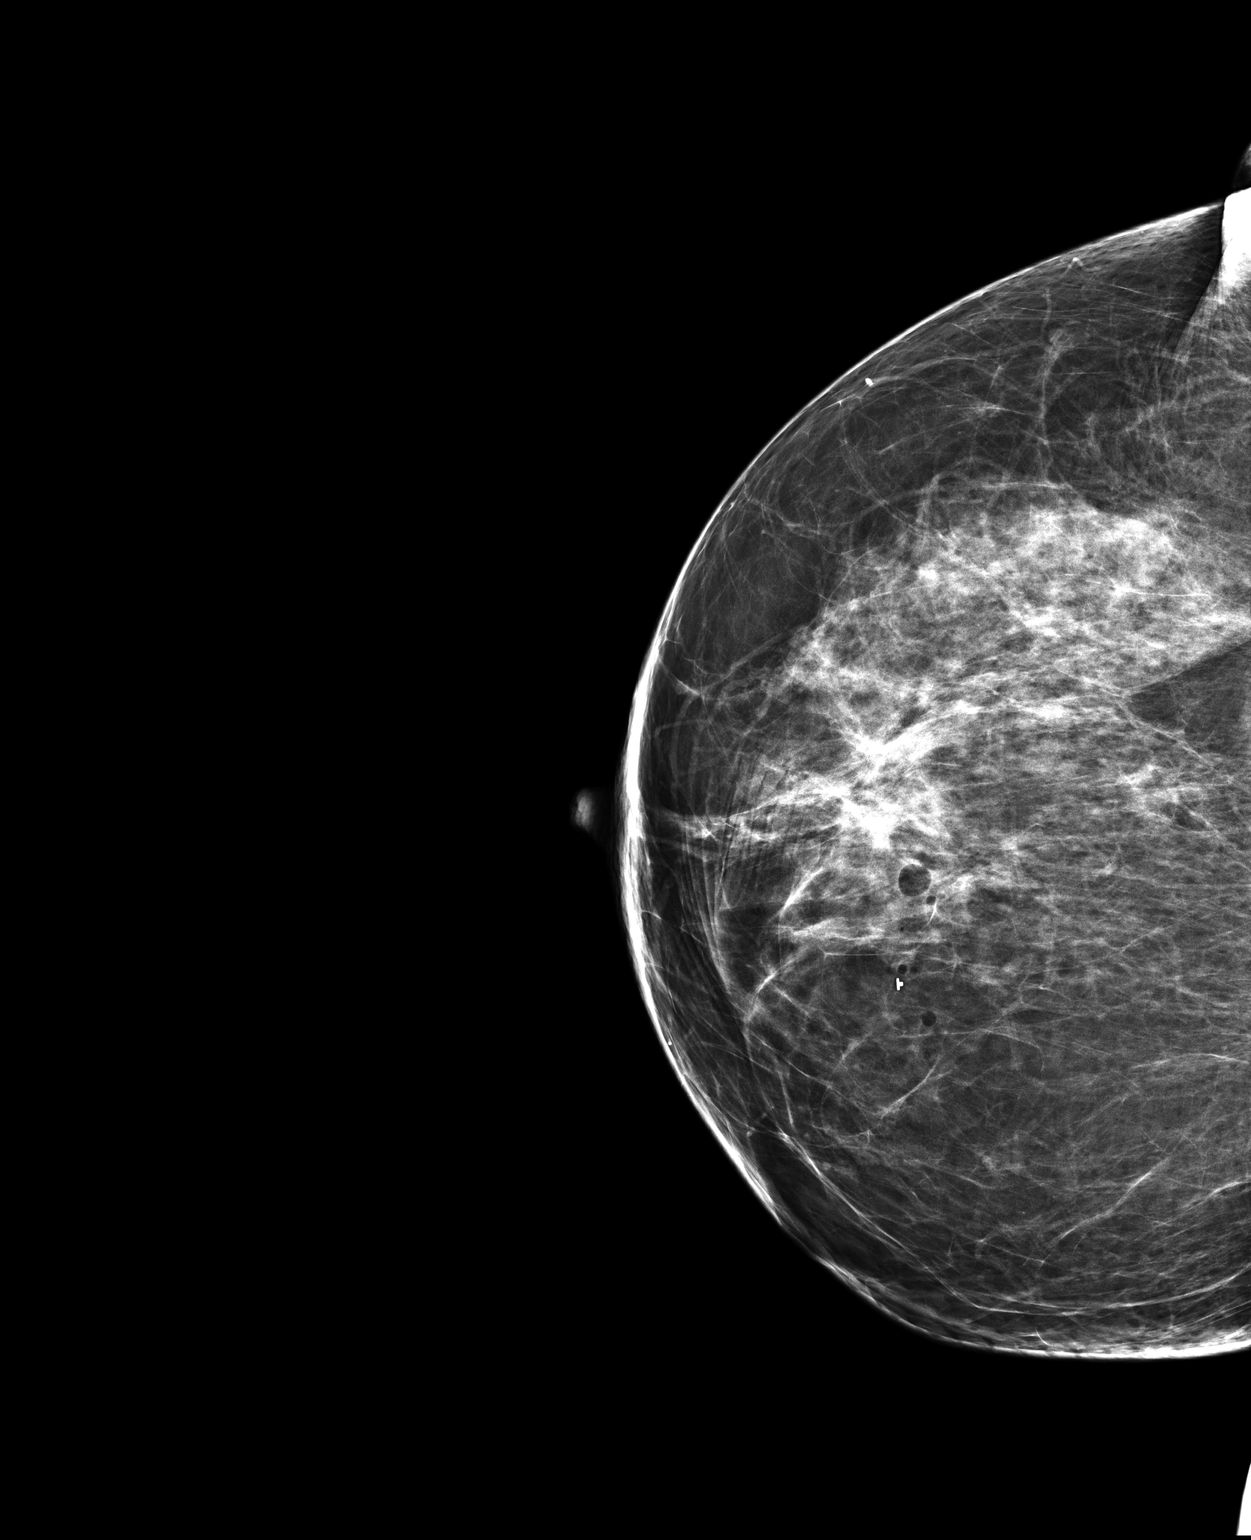

[2 of 2 positions shown; findings below may reference images not displayed]

FINDINGS: Mammographic images were obtained following stereotactic guided
biopsy of calcifications within the lower inner quadrant of the
right breast. At the conclusion of the procedure, a top hat shaped
tissue marker was placed at the biopsy site. The clip is
well-positioned at the site of the targeted calcifications.
IMPRESSION: Top hat shaped clip well-positioned at the site of the targeted
calcifications in the lower inner quadrant of the right breast.

Final Assessment: Post Procedure Mammograms for Marker Placement

## 2019-01-22 IMAGING — MG MM BREAST SURGICAL SPECIMEN
1 series · 1 of 1 positions shown · non-contrast
Comparison: Previous exam(s).

CLINICAL DATA: Right lumpectomy for invasive mammary carcinoma.

EXAM:
SPECIMEN RADIOGRAPH OF THE RIGHT BREAST

[R SPECIMEN]
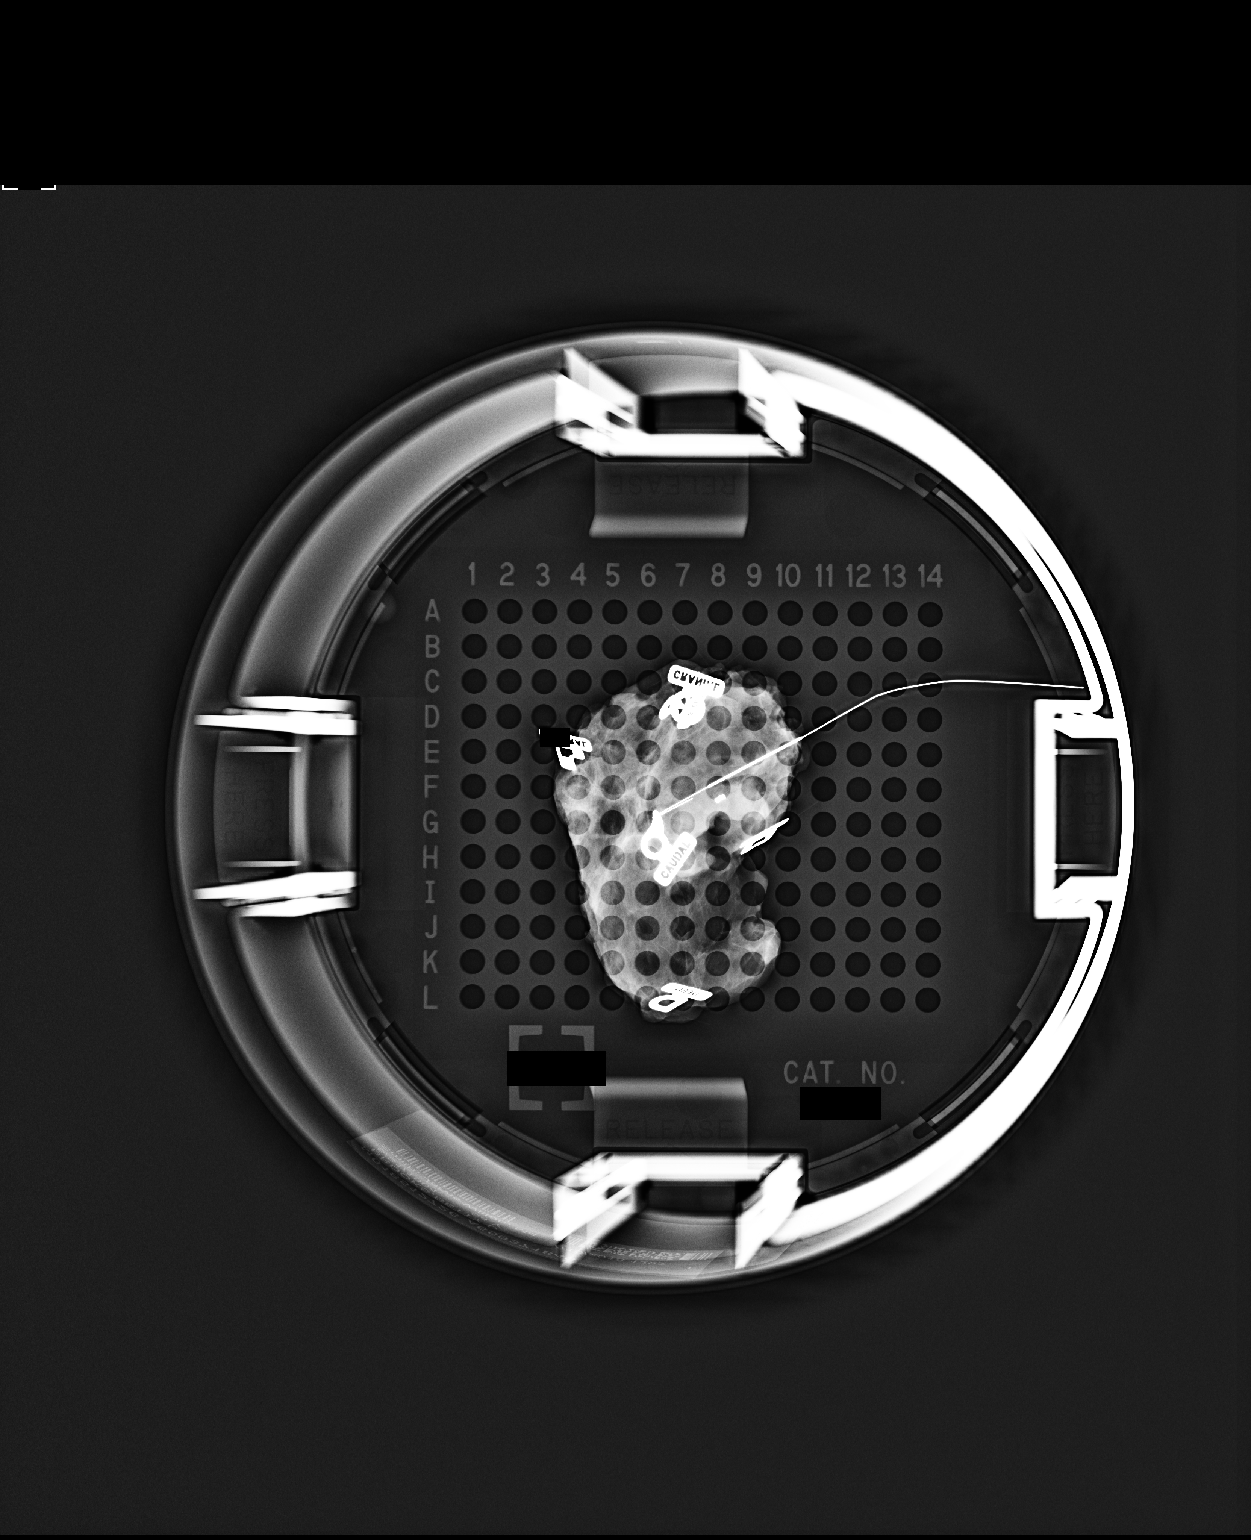

[1 of 1 positions shown; findings below may reference images not displayed]

FINDINGS: Status post excision of the right breast. The wire tip and biopsy
marker clip are present and are marked for pathology.
IMPRESSION: Specimen radiograph of the right breast.

## 2019-02-13 ENCOUNTER — Encounter: Payer: Self-pay | Admitting: *Deleted

## 2019-03-06 ENCOUNTER — Ambulatory Visit: Payer: 59 | Admitting: Radiation Oncology

## 2019-04-22 ENCOUNTER — Telehealth: Payer: Self-pay | Admitting: *Deleted

## 2019-04-22 NOTE — Telephone Encounter (Signed)
Per pt request to cancel 04/23/19 MD appt due to awaiting Covid19 results

## 2019-04-23 ENCOUNTER — Inpatient Hospital Stay: Payer: Managed Care, Other (non HMO) | Admitting: Oncology

## 2019-04-23 ENCOUNTER — Ambulatory Visit: Payer: Managed Care, Other (non HMO) | Admitting: Radiation Oncology

## 2019-04-24 ENCOUNTER — Telehealth: Payer: Self-pay | Admitting: *Deleted

## 2019-04-24 NOTE — Telephone Encounter (Signed)
Pt called yest. To cancel her appt. She was being tested fr covid. Pt states that she took 1st vaccine and was ok it was Thursday and then 2 days later she woke up with no taste and no smell. She got covid test and it is positive. Her son or husband do not have it. The only place she goes to is work and she does not know of anyone there that has covid. I made her f/u appt. For 2/11 radiation appt 2 pm and see dr Janese Banks 2:45. Pt agreeable. She asked what to do about next vaccine. I told her that I do  Not have the answer and asked her how she got the vaccine 1st time. She got it through [employer]. I told her to reach out to them for guidance for 2nd vaccine. She says she will

## 2019-05-15 ENCOUNTER — Other Ambulatory Visit: Payer: Self-pay

## 2019-05-16 ENCOUNTER — Encounter: Payer: Self-pay | Admitting: Radiation Oncology

## 2019-05-16 ENCOUNTER — Ambulatory Visit
Admission: RE | Admit: 2019-05-16 | Discharge: 2019-05-16 | Disposition: A | Discharge status: Home / Self Care | Payer: Managed Care, Other (non HMO) | Source: Ambulatory Visit | Attending: Radiation Oncology | Admitting: Radiation Oncology

## 2019-05-16 ENCOUNTER — Other Ambulatory Visit: Payer: Self-pay

## 2019-05-16 ENCOUNTER — Inpatient Hospital Stay: Payer: Managed Care, Other (non HMO) | Attending: Oncology | Admitting: Oncology

## 2019-05-16 ENCOUNTER — Encounter: Payer: Self-pay | Admitting: Oncology

## 2019-05-16 VITALS — BP 144/86 | HR 7 | Temp 98.5°F | Resp 16 | Wt 160.4 lb

## 2019-05-16 VITALS — BP 155/93 | HR 106 | Temp 98.5°F | Wt 160.0 lb

## 2019-05-16 DIAGNOSIS — Z79811 Long term (current) use of aromatase inhibitors: Secondary | ICD-10-CM | POA: Insufficient documentation

## 2019-05-16 DIAGNOSIS — Z170 Estrogen receptor positive status [ER+]: Secondary | ICD-10-CM | POA: Diagnosis not present

## 2019-05-16 DIAGNOSIS — C50311 Malignant neoplasm of lower-inner quadrant of right female breast: Secondary | ICD-10-CM | POA: Insufficient documentation

## 2019-05-16 DIAGNOSIS — Z853 Personal history of malignant neoplasm of breast: Secondary | ICD-10-CM | POA: Diagnosis not present

## 2019-05-16 DIAGNOSIS — Z08 Encounter for follow-up examination after completed treatment for malignant neoplasm: Secondary | ICD-10-CM | POA: Diagnosis not present

## 2019-05-16 DIAGNOSIS — Z923 Personal history of irradiation: Secondary | ICD-10-CM | POA: Insufficient documentation

## 2019-05-16 NOTE — Progress Notes (Signed)
Patient stated that she had been doing well with no complaints. Patient's last mammogram was on 10/29/2018.

## 2019-05-16 NOTE — Progress Notes (Signed)
Radiation Oncology Follow up Note  Name: Sandy Petty   Date:   05/16/2019 MRN:  [id number] DOB: [birth year]    This 59 y.o. female presents to the clinic today for 2-1/2-year follow-up status post whole breast radiation to her right breast for ER/PR positive invasive mammary carcinoma stage I.  REFERRING PROVIDER: Derinda Late, MD  HPI: Patient is a 59 year old female now out 2-1/2 years having completed whole breast radiation to her right breast for ER/PR positive invasive mammary carcinoma stage I.  Seen today in routine follow-up she is doing well.  She specifically denies breast tenderness cough or bone pain..  She is currently on Aromasin.  She had mammograms back in July which I have reviewed were BI-RADS 2 benign  COMPLICATIONS OF TREATMENT: none  FOLLOW UP COMPLIANCE: keeps appointments   PHYSICAL EXAM:  BP (!) 144/86 (BP Location: Left Arm, Patient Position: Sitting, Cuff Size: Normal)   Pulse (!) 7   Temp 98.5 F (36.9 C)   Resp 16   Wt 160 lb 6.4 oz (72.8 kg)   BMI 26.69 kg/m  Lungs are clear to A&P cardiac examination essentially unremarkable with regular rate and rhythm. No dominant mass or nodularity is noted in either breast in 2 positions examined. Incision is well-healed. No axillary or supraclavicular adenopathy is appreciated. Cosmetic result is excellent.  Well-developed well-nourished patient in NAD. HEENT reveals PERLA, EOMI, discs not visualized.  Oral cavity is clear. No oral mucosal lesions are identified. Neck is clear without evidence of cervical or supraclavicular adenopathy. Lungs are clear to A&P. Cardiac examination is essentially unremarkable with regular rate and rhythm without murmur rub or thrill. Abdomen is benign with no organomegaly or masses noted. Motor sensory and DTR levels are equal and symmetric in the upper and lower extremities. Cranial nerves II through XII are grossly intact. Proprioception is intact. No peripheral adenopathy or edema is  identified. No motor or sensory levels are noted. Crude visual fields are within normal range.  RADIOLOGY RESULTS: Mammograms reviewed compatible with above-stated findings  PLAN: Present time patient is doing well 2-1/2 years out with no evidence of disease.  I am pleased with her overall progress.  I have asked to see her back in 1 year for follow-up.  Patient knows to call with any concerns.  I would like to take this opportunity to thank you for allowing me to participate in the care of your patient.Sandy Filbert, MD

## 2019-05-19 NOTE — Progress Notes (Signed)
Hematology/Oncology Consult note [facility]  Telephone:([phone] [phone]  Patient Care Team: Derinda Late, MD as PCP - General (Family Medicine)   Name of the patient: Sandy Petty  [id number]  Feb 01, 1961   Date of visit: 05/19/19  Diagnosis- Invasive mammary carcinoma of right breast pathological prognostic stage IA [id number] ER positive, PR positive and her 2 neu negative   Chief complaint/ Reason for visit- routine f/u of breast cancer on exemestane  Heme/Onc history: 1. Patient is a 59 year old female who recently had a normal bilateral screening mammogram on 09/05/2016 which showed calcifications in her right breast warranting further evaluation. This was followed by a diagnostic right breast mammogram which showed 5 x 3 x 2 mm or calcification in the right breast lower inner quadrant. No associated mass.  2. She had stereotactic biopsy of this lesion which showed invasive mammary carcinomato type. 3 mm, grade 2 ER greater than 90% positive, PR 1-10% positive and HER-2/neu negative   3. Patient has been seen by Dr. Tamala Julian and is scheduled to undergo surgery on 10/11/16  4. Patient has been getting mammograms since the age of 74 and has not had any prior abnormal mammograms. She is G1 P1 L1. She used birth control pills for a long time and then switched to IUD which she has had for 10 years and recently had taken out. She has not had any menstrual cycles since the insertion of IUD.Hormone levels were checked post IUD removal and were in the post menopausal range.Family history significant for breast cancer in her maternal grandmother died in her 42s and pancreatic cancer in her maternal uncle.  5. DIAGNOSIS:  A. RIGHT BREAST MASS; NEEDLE LOCALIZED EXCISION:  - INVASIVE MAMMARY CARCINOMA OF NO SPECIAL TYPE.  - THE INFERIOR MARGIN RESECTION IS POSITIVE.  - BIOPSY SITE CHANGES, MARKER CLIP PRESENT.  - HEMATOMA.  - SEE SUMMARY BELOW.    B. SENTINEL LYMPH NODE; EXCISION:  - NO TUMOR SEEN IN ONE LYMPH NODE (0/1).   She did have re excision of positive inferior margin which did not reveal any residual tumor.  Size of tumor on final path was less than 5 mm and therefore Oncotype testing was not done and she did not require any adjuvant chemotherapy. She did complete her adjuvant radiation and was started on Arimidex. Baseline bone density scan showed osteopenia  Interval history- she is doing well on exemestane. She got covid after 1 dose of covid vaccine. She did receive her 2nd dose. Denies any significant side effects with aromasin. Appetite and weight are stable. No new aches/pains anywhere  ECOG PS- 0 Pain scale- 0   Review of systems- Review of Systems  Constitutional: Negative for chills, fever, malaise/fatigue and weight loss.  HENT: Negative for congestion, ear discharge and nosebleeds.   Eyes: Negative for blurred vision.  Respiratory: Negative for cough, hemoptysis, sputum production, shortness of breath and wheezing.   Cardiovascular: Negative for chest pain, palpitations, orthopnea and claudication.  Gastrointestinal: Negative for abdominal pain, blood in stool, constipation, diarrhea, heartburn, melena, nausea and vomiting.  Genitourinary: Negative for dysuria, flank pain, frequency, hematuria and urgency.  Musculoskeletal: Negative for back pain, joint pain and myalgias.  Skin: Negative for rash.  Neurological: Negative for dizziness, tingling, focal weakness, seizures, weakness and headaches.  Endo/Heme/Allergies: Does not bruise/bleed easily.  Psychiatric/Behavioral: Negative for depression and suicidal ideas. The patient does not have insomnia.       Allergies  Allergen Reactions  . Meloxicam  Rash  . Sulfa Antibiotics Rash     Past Medical History:  Diagnosis Date  . Anxiety   . Cancer [facility]) 10/2016   Right breast IMC and DCIS  . Complication of anesthesia   . Hypothyroidism   .  Personal history of radiation therapy 2018   right breast  . PONV (postoperative nausea and vomiting)    AFTER BREAST SURGERY 10-2016     Past Surgical History:  Procedure Laterality Date  . BACK SURGERY     NECK SURGERY-PLATE WITH 4 SCREWS  . BREAST BIOPSY Right 09/19/2016   [facility] amd DCIS  . BREAST LUMPECTOMY Right 10/11/2016   [facility] and DCIS, LN negative Re-excision done 8/3 for clear margins  . CARPAL TUNNEL RELEASE    . COLONOSCOPY  2015  . ELBOW SURGERY     X2  . MASTECTOMY, PARTIAL Right 11/04/2016   Procedure: RE-EXCISION INFERIOR MARGIN RIGHT BREAST;  Surgeon: Leonie Green, MD;  Location: [facility] ORS;  Service: General;  Laterality: Right;  . PARTIAL MASTECTOMY WITH NEEDLE LOCALIZATION Right 10/11/2016   Procedure: PARTIAL MASTECTOMY WITH NEEDLE LOCALIZATION;  Surgeon: Leonie Green, MD;  Location: [facility] ORS;  Service: General;  Laterality: Right;  . SENTINEL NODE BIOPSY Right 10/11/2016   Procedure: SENTINEL NODE BIOPSY;  Surgeon: Leonie Green, MD;  Location: [facility] ORS;  Service: General;  Laterality: Right;    Social History   Socioeconomic History  . Marital status: Married    Spouse name: Not on file  . Number of children: Not on file  . Years of education: Not on file  . Highest education level: Not on file  Occupational History  . Not on file  Tobacco Use  . Smoking status: Never Smoker  . Smokeless tobacco: Never Used  Substance and Sexual Activity  . Alcohol use: Yes    Comment: Social  . Drug use: No  . Sexual activity: Yes  Other Topics Concern  . Not on file  Social History Narrative  . Not on file   Social Determinants of Health   Financial Resource Strain:   . Difficulty of Paying Living Expenses: Not on file  Food Insecurity:   . Worried About Charity fundraiser in the Last Year: Not on file  . Ran Out of Food in the Last Year: Not on file  Transportation Needs:   . Lack of Transportation (Medical): Not on file  . Lack of  Transportation (Non-Medical): Not on file  Physical Activity:   . Days of Exercise per Week: Not on file  . Minutes of Exercise per Session: Not on file  Stress:   . Feeling of Stress : Not on file  Social Connections:   . Frequency of Communication with Friends and Family: Not on file  . Frequency of Social Gatherings with Friends and Family: Not on file  . Attends Religious Services: Not on file  . Active Member of Clubs or Organizations: Not on file  . Attends Archivist Meetings: Not on file  . Marital Status: Not on file  Intimate Partner Violence:   . Fear of Current or Ex-Partner: Not on file  . Emotionally Abused: Not on file  . Physically Abused: Not on file  . Sexually Abused: Not on file    Family History  Problem Relation Age of Onset  . Breast cancer Maternal Grandmother 13  . Cancer Maternal Grandmother   . Cancer Maternal Uncle   . Cancer Maternal Grandfather  Current Outpatient Medications:  .  CALCIUM CARBONATE-VITAMIN D PO, Take 1 tablet by mouth daily. , Disp: , Rfl:  .  exemestane (AROMASIN) 25 MG tablet, TAKE 1 TABLET BY MOUTH  DAILY AFTER BREAKFAST, Disp: 90 tablet, Rfl: 3 .  levothyroxine (SYNTHROID, LEVOTHROID) 112 MCG tablet, Take 112 mcg by mouth daily before breakfast. , Disp: , Rfl:  .  venlafaxine XR (EFFEXOR-XR) 150 MG 24 hr capsule, TAKE 1 CAPSULE BY MOUTH  DAILY WITH BREAKFAST, Disp: 90 capsule, Rfl: 3 .  ibuprofen (ADVIL,MOTRIN) 200 MG tablet, Take 600 mg by mouth every 8 (eight) hours as needed (for pain.)., Disp: , Rfl:   Physical exam:  Vitals:   05/16/19 1534  BP: (!) 155/93  Pulse: (!) 106  Temp: 98.5 F (36.9 C)  TempSrc: Tympanic  Weight: 160 lb (72.6 kg)   Physical Exam HENT:     Head: Normocephalic and atraumatic.  Eyes:     Pupils: Pupils are equal, round, and reactive to light.  Cardiovascular:     Rate and Rhythm: Normal rate and regular rhythm.     Heart sounds: Normal heart sounds.  Pulmonary:      Effort: Pulmonary effort is normal.     Breath sounds: Normal breath sounds.  Abdominal:     General: Bowel sounds are normal.     Palpations: Abdomen is soft.  Musculoskeletal:     Cervical back: Normal range of motion.  Skin:    General: Skin is warm and dry.  Neurological:     Mental Status: She is alert and oriented to person, place, and time.    Breast exam was performed in seated and lying down position. Patient is status post right lumpectomy with a well-healed surgical scar. No evidence of any palpable masses. No evidence of axillary adenopathy. No evidence of any palpable masses or lumps in the left breast. No evidence of leftt axillary adenopathy    Assessment and plan- Patient is a 59 y.o. female with history of stage Ia invasive mammary carcinoma of the right breast status post lumpectomy and adjuvant radiation.she is currently on exemestane and this is a routine f/u visit  Clinically she is doing well. No concerning signs and symptoms of recurrence based on todays exam. We will schedule mammogram for July 2021. Repeat bone density at this time. I will see her in 6 months no labs   Visit Diagnosis 1. Encounter for follow-up surveillance of breast cancer   2. Use of exemestane (Aromasin)      Dr. Randa Evens, MD, MPH [facility] at [facility] [phone] 05/19/2019 6:13 PM

## 2019-06-05 ENCOUNTER — Other Ambulatory Visit: Payer: Self-pay | Admitting: Specialist

## 2019-06-06 ENCOUNTER — Other Ambulatory Visit: Payer: Managed Care, Other (non HMO)

## 2019-06-13 ENCOUNTER — Other Ambulatory Visit: Payer: Managed Care, Other (non HMO)

## 2019-06-21 ENCOUNTER — Encounter
Admission: RE | Admit: 2019-06-21 | Discharge: 2019-06-21 | Disposition: A | Discharge status: Home / Self Care | Payer: Managed Care, Other (non HMO) | Source: Ambulatory Visit | Attending: Specialist | Admitting: Specialist

## 2019-06-21 ENCOUNTER — Other Ambulatory Visit: Payer: Self-pay

## 2019-06-21 DIAGNOSIS — Z01818 Encounter for other preprocedural examination: Secondary | ICD-10-CM | POA: Diagnosis not present

## 2019-06-21 HISTORY — DX: Depression, unspecified: F32.A

## 2019-06-21 NOTE — Patient Instructions (Signed)
Your procedure is scheduled on: Monday July 01, 2019  Report to Day Surgery. To find out your arrival time please call [phone] between 1PM - 3PM on Friday June 28, 2019.  Remember: Instructions that are not followed completely may result in serious medical risk,  up to and including death, or upon the discretion of your surgeon and anesthesiologist your  surgery may need to be rescheduled.     _X__ 1. Do not eat food after midnight the night before your procedure.                 No gum chewing or hard candies. You may drink clear liquids up to 2 hours                 before you are scheduled to arrive for your surgery- DO not drink clear                 liquids within 2 hours of the start of your surgery.                 Clear Liquids include:  water, apple juice without pulp, clear Gatorade, G2 or                  Gatorade Zero (avoid Red/Purple/Blue), Black Coffee or Tea (Do not add                 anything to coffee or tea).  __X__2.  On the morning of surgery brush your teeth with toothpaste and water, you                may rinse your mouth with mouthwash if you wish.  Do not swallow any toothpaste of mouthwash.     _X__ 3.  No Alcohol for 24 hours before or after surgery.   _X__ 4.  Do Not Smoke or use e-cigarettes For 24 Hours Prior to Your Surgery.                 Do not use any chewable tobacco products for at least 6 hours prior to                 Surgery.  __x__ 5.  Notify your doctor if there is any change in your medical condition      (cold, fever, infections).     Do not wear jewelry, make-up, hairpins, clips or nail polish. Do not wear lotions, powders, or perfumes. You may wear deodorant. Do not shave 48 hours prior to surgery. Men may shave face and neck. Do not bring valuables to the hospital.    [facility] is not responsible for any belongings or valuables.  Contacts, dentures or bridgework may not be worn into surgery. Leave  your suitcase in the car. After surgery it may be brought to your room. For patients admitted to the hospital, discharge time is determined by your treatment team.   Patients discharged the day of surgery will not be allowed to drive home.   Make arrangements for someone to be with you for the first 24 hours of your Same Day Discharge.   __x__ Take these medicines the morning of surgery with A SIP OF WATER:    1. levothyroxine (SYNTHROID, LEVOTHROID) 112   2. venlafaxine XR (EFFEXOR-XR) 150 MG   __x__ Use CHG Soap (or wipes) as directed  __x__ Stop Anti-inflammatories such as ibuprofen, Aleve, naproxen, aspirin and or BC powders.    __x__ Stop supplements  until after surgery.    __x__ Do not start any herbal supplements before your surgery.

## 2019-06-27 ENCOUNTER — Other Ambulatory Visit: Payer: Managed Care, Other (non HMO)

## 2019-06-28 ENCOUNTER — Other Ambulatory Visit
Admission: RE | Admit: 2019-06-28 | Discharge: 2019-06-28 | Disposition: A | Discharge status: Home / Self Care | Payer: Managed Care, Other (non HMO) | Source: Ambulatory Visit | Attending: Specialist | Admitting: Specialist

## 2019-06-28 ENCOUNTER — Other Ambulatory Visit: Payer: Self-pay

## 2019-06-28 DIAGNOSIS — Z20822 Contact with and (suspected) exposure to covid-19: Secondary | ICD-10-CM | POA: Insufficient documentation

## 2019-06-28 DIAGNOSIS — Z01812 Encounter for preprocedural laboratory examination: Secondary | ICD-10-CM | POA: Insufficient documentation

## 2019-06-28 LAB — SARS CORONAVIRUS 2 (TAT 6-24 HRS): SARS Coronavirus 2: NEGATIVE

## 2019-07-01 ENCOUNTER — Ambulatory Visit
Admission: RE | Admit: 2019-07-01 | Discharge: 2019-07-01 | Disposition: A | Discharge status: Home / Self Care | Payer: Managed Care, Other (non HMO) | Source: Ambulatory Visit | Attending: Specialist | Admitting: Specialist

## 2019-07-01 ENCOUNTER — Ambulatory Visit: Payer: Managed Care, Other (non HMO) | Admitting: Anesthesiology

## 2019-07-01 ENCOUNTER — Encounter: Payer: Self-pay | Admitting: Specialist

## 2019-07-01 ENCOUNTER — Other Ambulatory Visit: Payer: Self-pay

## 2019-07-01 ENCOUNTER — Encounter
Admission: RE | Disposition: A | Discharge status: Home / Self Care | Payer: Self-pay | Source: Ambulatory Visit | Attending: Specialist

## 2019-07-01 DIAGNOSIS — F329 Major depressive disorder, single episode, unspecified: Secondary | ICD-10-CM | POA: Insufficient documentation

## 2019-07-01 DIAGNOSIS — G709 Myoneural disorder, unspecified: Secondary | ICD-10-CM | POA: Insufficient documentation

## 2019-07-01 DIAGNOSIS — G5602 Carpal tunnel syndrome, left upper limb: Secondary | ICD-10-CM | POA: Insufficient documentation

## 2019-07-01 DIAGNOSIS — E039 Hypothyroidism, unspecified: Secondary | ICD-10-CM | POA: Diagnosis not present

## 2019-07-01 DIAGNOSIS — F419 Anxiety disorder, unspecified: Secondary | ICD-10-CM | POA: Diagnosis not present

## 2019-07-01 HISTORY — PX: CARPAL TUNNEL RELEASE: SHX101

## 2019-07-01 SURGERY — CARPAL TUNNEL RELEASE
Anesthesia: General | Site: Wrist | Laterality: Left

## 2019-07-01 MED ORDER — CLINDAMYCIN PHOSPHATE 600 MG/50ML IV SOLN
INTRAVENOUS | Status: AC
  Filled 2019-07-01: qty 50

## 2019-07-01 MED ORDER — BUPIVACAINE HCL (PF) 0.5 % IJ SOLN
INTRAMUSCULAR | Status: AC
  Filled 2019-07-01: qty 30

## 2019-07-01 MED ORDER — MIDAZOLAM HCL 2 MG/2ML IJ SOLN
INTRAMUSCULAR | Status: DC | PRN
  Administered 2019-07-01: 2 mg via INTRAVENOUS

## 2019-07-01 MED ORDER — CEFAZOLIN SODIUM-DEXTROSE 2-4 GM/100ML-% IV SOLN
INTRAVENOUS | Status: AC
  Filled 2019-07-01: qty 100

## 2019-07-01 MED ORDER — ONDANSETRON HCL 4 MG/2ML IJ SOLN
INTRAMUSCULAR | Status: DC | PRN
  Administered 2019-07-01 (×2): 4 mg via INTRAVENOUS

## 2019-07-01 MED ORDER — FAMOTIDINE 20 MG PO TABS
ORAL_TABLET | ORAL | Status: AC
  Filled 2019-07-01: qty 1

## 2019-07-01 MED ORDER — EPHEDRINE SULFATE 50 MG/ML IJ SOLN
INTRAMUSCULAR | Status: DC | PRN
  Administered 2019-07-01: 5 mg via INTRAVENOUS
  Administered 2019-07-01: 10 mg via INTRAVENOUS

## 2019-07-01 MED ORDER — GABAPENTIN 300 MG PO CAPS
ORAL_CAPSULE | ORAL | Status: AC
  Administered 2019-07-01: 07:00:00 300 mg via ORAL
  Filled 2019-07-01: qty 1

## 2019-07-01 MED ORDER — FENTANYL CITRATE (PF) 100 MCG/2ML IJ SOLN
INTRAMUSCULAR | Status: AC
  Filled 2019-07-01: qty 2

## 2019-07-01 MED ORDER — FENTANYL CITRATE (PF) 100 MCG/2ML IJ SOLN: 25.0000 ug | INTRAMUSCULAR | Status: DC | PRN

## 2019-07-01 MED ORDER — SCOPOLAMINE 1 MG/3DAYS TD PT72: 1.0000 | MEDICATED_PATCH | TRANSDERMAL | Status: DC

## 2019-07-01 MED ORDER — CEFAZOLIN SODIUM-DEXTROSE 2-4 GM/100ML-% IV SOLN
2.0000 g | INTRAVENOUS | Status: AC
  Administered 2019-07-01: 2 g via INTRAVENOUS

## 2019-07-01 MED ORDER — MIDAZOLAM HCL 2 MG/2ML IJ SOLN
INTRAMUSCULAR | Status: AC
  Filled 2019-07-01: qty 2

## 2019-07-01 MED ORDER — LACTATED RINGERS IV SOLN: INTRAVENOUS | Status: DC

## 2019-07-01 MED ORDER — PROPOFOL 500 MG/50ML IV EMUL: INTRAVENOUS | Status: DC | PRN

## 2019-07-01 MED ORDER — LACTATED RINGERS IV SOLN: INTRAVENOUS | Status: DC | PRN

## 2019-07-01 MED ORDER — BUPIVACAINE HCL 0.5 % IJ SOLN
INTRAMUSCULAR | Status: DC | PRN
  Administered 2019-07-01: 16 mL

## 2019-07-01 MED ORDER — ONDANSETRON HCL 4 MG/2ML IJ SOLN: 4.0000 mg | Freq: Once | INTRAMUSCULAR | Status: DC | PRN

## 2019-07-01 MED ORDER — SCOPOLAMINE 1 MG/3DAYS TD PT72
MEDICATED_PATCH | TRANSDERMAL | Status: AC
  Administered 2019-07-01: 07:00:00 1.5 mg via TRANSDERMAL
  Filled 2019-07-01: qty 1

## 2019-07-01 MED ORDER — FENTANYL CITRATE (PF) 100 MCG/2ML IJ SOLN
INTRAMUSCULAR | Status: DC | PRN
  Administered 2019-07-01: 50 ug via INTRAVENOUS
  Administered 2019-07-01: 25 ug via INTRAVENOUS

## 2019-07-01 MED ORDER — GLYCOPYRROLATE 0.2 MG/ML IJ SOLN
INTRAMUSCULAR | Status: DC | PRN
  Administered 2019-07-01: .2 mg via INTRAVENOUS

## 2019-07-01 MED ORDER — SUCCINYLCHOLINE CHLORIDE 20 MG/ML IJ SOLN
INTRAMUSCULAR | Status: DC | PRN
  Administered 2019-07-01: 120 mg via INTRAVENOUS

## 2019-07-01 MED ORDER — FAMOTIDINE 20 MG PO TABS
20.0000 mg | ORAL_TABLET | Freq: Once | ORAL | Status: AC
  Administered 2019-07-01: 20 mg via ORAL

## 2019-07-01 MED ORDER — HYDROCODONE-ACETAMINOPHEN 5-325 MG PO TABS: 1.0000 | ORAL_TABLET | Freq: Four times a day (QID) | ORAL | 0 refills | Status: DC | PRN

## 2019-07-01 MED ORDER — PROPOFOL 10 MG/ML IV BOLUS
INTRAVENOUS | Status: AC
  Filled 2019-07-01: qty 40

## 2019-07-01 MED ORDER — CHLORHEXIDINE GLUCONATE CLOTH 2 % EX PADS: 6.0000 | MEDICATED_PAD | Freq: Once | CUTANEOUS | Status: DC

## 2019-07-01 MED ORDER — GABAPENTIN 300 MG PO CAPS: 300.0000 mg | ORAL_CAPSULE | ORAL | Status: AC

## 2019-07-01 MED ORDER — DEXAMETHASONE SODIUM PHOSPHATE 10 MG/ML IJ SOLN
INTRAMUSCULAR | Status: DC | PRN
  Administered 2019-07-01: 10 mg via INTRAVENOUS

## 2019-07-01 MED ORDER — GABAPENTIN 400 MG PO CAPS: 400.0000 mg | ORAL_CAPSULE | Freq: Three times a day (TID) | ORAL | 3 refills | Status: DC

## 2019-07-01 MED ORDER — CLINDAMYCIN PHOSPHATE 600 MG/50ML IV SOLN
600.0000 mg | INTRAVENOUS | Status: AC
  Administered 2019-07-01: 600 mg via INTRAVENOUS

## 2019-07-01 MED ORDER — PROPOFOL 10 MG/ML IV BOLUS
INTRAVENOUS | Status: DC | PRN
  Administered 2019-07-01: 150 mg via INTRAVENOUS

## 2019-07-01 MED ORDER — PHENYLEPHRINE HCL (PRESSORS) 10 MG/ML IV SOLN
INTRAVENOUS | Status: DC | PRN
  Administered 2019-07-01 (×2): 200 ug via INTRAVENOUS

## 2019-07-01 MED ORDER — LIDOCAINE HCL (CARDIAC) PF 100 MG/5ML IV SOSY
PREFILLED_SYRINGE | INTRAVENOUS | Status: DC | PRN
  Administered 2019-07-01: 100 mg via INTRAVENOUS

## 2019-07-01 SURGICAL SUPPLY — 29 items
APL PRP STRL LF DISP 70% ISPRP (MISCELLANEOUS) ×1
BLADE SURG MINI STRL (BLADE) ×3 IMPLANT
BNDG ESMARK 4X12 TAN STRL LF (GAUZE/BANDAGES/DRESSINGS) ×3 IMPLANT
CANISTER SUCT 1200ML W/VALVE (MISCELLANEOUS) ×3 IMPLANT
CHLORAPREP W/TINT 26 (MISCELLANEOUS) ×3 IMPLANT
COVER WAND RF STERILE (DRAPES) ×3 IMPLANT
CUFF TOURN SGL QUICK 18X4 (TOURNIQUET CUFF) IMPLANT
DRSG GAUZE FLUFF 36X18 (GAUZE/BANDAGES/DRESSINGS) ×3 IMPLANT
ELECT REM PT RETURN 9FT ADLT (ELECTROSURGICAL) ×3
ELECTRODE REM PT RTRN 9FT ADLT (ELECTROSURGICAL) ×1 IMPLANT
GAUZE XEROFORM 1X8 LF (GAUZE/BANDAGES/DRESSINGS) ×3 IMPLANT
GLOVE BIO SURGEON STRL SZ8 (GLOVE) ×3 IMPLANT
GOWN STRL REUS W/ TWL LRG LVL3 (GOWN DISPOSABLE) ×1 IMPLANT
GOWN STRL REUS W/TWL LRG LVL3 (GOWN DISPOSABLE) ×3
GOWN STRL REUS W/TWL LRG LVL4 (GOWN DISPOSABLE) ×3 IMPLANT
KIT TURNOVER KIT A (KITS) ×3 IMPLANT
NS IRRIG 500ML POUR BTL (IV SOLUTION) ×3 IMPLANT
PACK EXTREMITY ARMC (MISCELLANEOUS) ×3 IMPLANT
PAD PREP 24X41 OB/GYN DISP (PERSONAL CARE ITEMS) ×3 IMPLANT
PADDING CAST 4IN STRL (MISCELLANEOUS) ×2
PADDING CAST BLEND 4X4 STRL (MISCELLANEOUS) ×1 IMPLANT
SPLINT CAST 1 STEP 3X12 (MISCELLANEOUS) ×3 IMPLANT
STOCKINETTE 48X4 2 PLY STRL (GAUZE/BANDAGES/DRESSINGS) ×1 IMPLANT
STOCKINETTE BIAS CUT 4 980044 (GAUZE/BANDAGES/DRESSINGS) ×3 IMPLANT
STOCKINETTE STRL 4IN 9604848 (GAUZE/BANDAGES/DRESSINGS) ×3 IMPLANT
SUT ETHILON 4-0 (SUTURE) ×3
SUT ETHILON 4-0 FS2 18XMFL BLK (SUTURE) ×1
SUT ETHILON 5-0 FS-2 18 BLK (SUTURE) ×1 IMPLANT
SUTURE ETHLN 4-0 FS2 18XMF BLK (SUTURE) ×1 IMPLANT

## 2019-07-01 NOTE — Anesthesia Procedure Notes (Signed)
Procedure Name: Intubation Performed by: Kelton Pillar, CRNA Pre-anesthesia Checklist: Patient identified, Emergency Drugs available, Suction available and Patient being monitored Patient Re-evaluated:Patient Re-evaluated prior to induction Oxygen Delivery Method: Circle system utilized Preoxygenation: Pre-oxygenation with 100% oxygen Induction Type: IV induction Ventilation: Mask ventilation without difficulty Laryngoscope Size: McGraph and 4 Grade View: Grade I Tube type: Oral Tube size: 7.0 mm Number of attempts: 1 Airway Equipment and Method: Stylet Placement Confirmation: ETT inserted through vocal cords under direct vision,  positive ETCO2,  CO2 detector and breath sounds checked- equal and bilateral Tube secured with: Tape Dental Injury: Teeth and Oropharynx as per pre-operative assessment

## 2019-07-01 NOTE — Anesthesia Preprocedure Evaluation (Signed)
Anesthesia Evaluation  Patient identified by MRN, date of birth, ID band Patient awake    Reviewed: Allergy & Precautions, H&P , NPO status , Patient's Chart, lab work & pertinent test results, reviewed documented beta blocker date and time   History of Anesthesia Complications (+) PONV and history of anesthetic complications  Airway Mallampati: II  TM Distance: >3 FB Neck ROM: full    Dental  (+) Teeth Intact   Pulmonary neg pulmonary ROS,    Pulmonary exam normal        Cardiovascular Exercise Tolerance: Good negative cardio ROS Normal cardiovascular exam Rate:Normal     Neuro/Psych  Headaches, PSYCHIATRIC DISORDERS Anxiety Depression  Neuromuscular disease    GI/Hepatic negative GI ROS, Neg liver ROS,   Endo/Other  Hypothyroidism   Renal/GU negative Renal ROS  negative genitourinary   Musculoskeletal   Abdominal   Peds  Hematology negative hematology ROS (+)   Anesthesia Other Findings   Reproductive/Obstetrics negative OB ROS                             Anesthesia Physical Anesthesia Plan  ASA: II  Anesthesia Plan: General LMA   Post-op Pain Management:    Induction:   PONV Risk Score and Plan:   Airway Management Planned:   Additional Equipment:   Intra-op Plan:   Post-operative Plan:   Informed Consent: I have reviewed the patients History and Physical, chart, labs and discussed the procedure including the risks, benefits and alternatives for the proposed anesthesia with the patient or authorized representative who has indicated his/her understanding and acceptance.       Plan Discussed with: CRNA  Anesthesia Plan Comments:         Anesthesia Quick Evaluation

## 2019-07-01 NOTE — H&P (Signed)
THE PATIENT WAS SEEN PRIOR TO SURGERY TODAY.  HISTORY, ALLERGIES, HOME MEDICATIONS AND OPERATIVE PROCEDURE WERE REVIEWED. RISKS AND BENEFITS OF SURGERY DISCUSSED WITH PATIENT AGAIN.  NO CHANGES FROM INITIAL HISTORY AND PHYSICAL NOTED.    

## 2019-07-01 NOTE — Transfer of Care (Signed)
Immediate Anesthesia Transfer of Care Note  Patient: Sandy Petty  Procedure(s) Performed: CARPAL TUNNEL RELEASE (Left Wrist)  Patient Location: PACU  Anesthesia Type:General  Level of Consciousness: drowsy, patient cooperative and responds to stimulation  Airway & Oxygen Therapy: Patient Spontanous Breathing and Patient connected to face mask oxygen  Post-op Assessment: Report given to RN and Post -op Vital signs reviewed and stable  Post vital signs: Reviewed and stable  Last Vitals:  Vitals Value Taken Time  BP 129/83 07/01/19 0856  Temp 36.2 C 07/01/19 0856  Pulse 96 07/01/19 0859  Resp 11 07/01/19 0859  SpO2 100 % 07/01/19 0859  Vitals shown include unvalidated device data.  Last Pain:  Vitals:   07/01/19 0639  TempSrc: Oral  PainSc: 0-No pain         Complications: No apparent anesthesia complications

## 2019-07-01 NOTE — Op Note (Signed)
07/01/2019  8:45 AM  PATIENT:  [name]    PRE-OPERATIVE DIAGNOSIS: LEFT CARPAL TUNNEL SYNDROME  POST-OPERATIVE DIAGNOSIS: LEFT CARPAL TUNNEL SYNDROME  PROCEDURE:  LEFT CARPAL TUNNEL RELEASE  SURGEON: Park Breed, MD  TOURNIQUET TIME: 16  MIN   ANESTHESIA:   General  PREOPERATIVE INDICATIONS:  Sandy Petty is a  59 y.o. female with a diagnosis of left carpal tunnel syndrome who failed conservative measures and elected for surgical management.    The risks benefits and alternatives were discussed with the patient preoperatively including but not limited to the risks of infection, bleeding, nerve injury, incomplete relief of symptoms, pillar pain, cardiopulmonary complications, the need for revision surgery, among others, and the patient was willing to proceed.  OPERATIVE FINDINGS: Thickened volar ligament and nerve compression.  OPERATIVE PROCEDURE: The patient is brought to the operating room placed in the supine position. General anesthesia was administered. The left upper extremity was prepped and draped in usual sterile fashion. Time out was performed. The arm was elevated and exsanguinated and the tourniquet was inflated. Incision was made in line with the radial border of the ring finger. The carpal tunnel transverse fascia was identified, cleaned, and incised sharply. The common sensory branches were visualized along with the superficial palmar arch and protected.  The median nerve was protected below. A Kelly clamp was  placed underneath the transverse carpal ligament, protecting the nerve. I released the ligament completely, and then released the proximal distal volar forearm fascia. The nerve was identified, and visualized, and protected throughout the case. The motor branch was intact upon inspection. No masses or abnormalities were identified in the ulnar bursa.  The wounds were irrigated copiously and the skin closed with nylon. The wound was injected with 1/2 %  marcaine followed by a sterile dressing and volar splint. Tourniquet was deflated with good return of blood flow to all fingers. Sponge and needle counts were correct.  The patient tolerated this well, with no complications. The patient was awakened and taken to recovery in good condition.

## 2019-07-01 NOTE — Discharge Instructions (Signed)

## 2019-07-01 NOTE — Anesthesia Postprocedure Evaluation (Signed)
Anesthesia Post Note  Patient: Sandy Petty  Procedure(s) Performed: CARPAL TUNNEL RELEASE (Left Wrist)  Patient location during evaluation: PACU Anesthesia Type: General Level of consciousness: awake and alert Pain management: pain level controlled Vital Signs Assessment: post-procedure vital signs reviewed and stable Respiratory status: spontaneous breathing, nonlabored ventilation, respiratory function stable and patient connected to nasal cannula oxygen Cardiovascular status: blood pressure returned to baseline and stable Postop Assessment: no apparent nausea or vomiting Anesthetic complications: no     Last Vitals:  Vitals:   07/01/19 0856 07/01/19 0901  BP: 129/83   Pulse: 95 95  Resp: 18 12  Temp: (!) 36.2 C   SpO2: 100% 100%    Last Pain:  Vitals:   07/01/19 0856  TempSrc:   PainSc: Asleep                 Molli Barrows

## 2019-07-24 ENCOUNTER — Telehealth: Payer: Self-pay | Admitting: Oncology

## 2019-07-24 NOTE — Telephone Encounter (Signed)
MD will not be in the office on 11-12-19. Writer phoned patient on this date and rescheduled appt.

## 2019-10-05 ENCOUNTER — Other Ambulatory Visit: Payer: Self-pay | Admitting: Oncology

## 2019-10-30 ENCOUNTER — Ambulatory Visit
Admission: RE | Admit: 2019-10-30 | Discharge: 2019-10-30 | Disposition: A | Discharge status: Home / Self Care | Payer: Managed Care, Other (non HMO) | Source: Ambulatory Visit | Attending: Oncology | Admitting: Oncology

## 2019-10-30 DIAGNOSIS — Z853 Personal history of malignant neoplasm of breast: Secondary | ICD-10-CM

## 2019-10-30 DIAGNOSIS — Z08 Encounter for follow-up examination after completed treatment for malignant neoplasm: Secondary | ICD-10-CM | POA: Diagnosis not present

## 2019-11-01 NOTE — Progress Notes (Signed)
Does she have f/u soon? She probably needs to have that discussion with Dr. Janese Banks soon given the significant change.   Faythe Casa, NP 11/01/2019 12:57 PM

## 2019-11-03 NOTE — Progress Notes (Signed)
I think 12/03/19 is okay.   Faythe Casa, NP 11/03/2019 11:10 AM

## 2019-11-12 ENCOUNTER — Ambulatory Visit: Payer: Managed Care, Other (non HMO) | Admitting: Oncology

## 2019-12-03 ENCOUNTER — Encounter: Payer: Self-pay | Admitting: Oncology

## 2019-12-03 ENCOUNTER — Inpatient Hospital Stay: Payer: Managed Care, Other (non HMO) | Attending: Oncology | Admitting: Oncology

## 2019-12-03 DIAGNOSIS — M85852 Other specified disorders of bone density and structure, left thigh: Secondary | ICD-10-CM

## 2019-12-03 DIAGNOSIS — Z853 Personal history of malignant neoplasm of breast: Secondary | ICD-10-CM

## 2019-12-03 DIAGNOSIS — Z08 Encounter for follow-up examination after completed treatment for malignant neoplasm: Secondary | ICD-10-CM | POA: Diagnosis not present

## 2019-12-03 MED ORDER — ALENDRONATE SODIUM 70 MG PO TABS: 70.0000 mg | ORAL_TABLET | ORAL | 5 refills | Status: DC

## 2019-12-03 NOTE — Progress Notes (Signed)
I connected with Sandy Petty on 12/03/19 at  2:30 PM EDT by video enabled telemedicine visit and verified that I am speaking with the correct person using two identifiers.   I discussed the limitations, risks, security and privacy concerns of performing an evaluation and management service by telemedicine and the availability of in-person appointments. I also discussed with the patient that there may be a patient responsible charge related to this service. The patient expressed understanding and agreed to proceed.  Other persons participating in the visit and their role in the encounter:  none  Patient's location:  home Provider's location:  work  Risk analyst Complaint: Routine follow-up for breast cancer and to discuss bone density scan results  History of present illness: 1. Patient is a 59 year old female who recently had a normal bilateral screening mammogram on 09/05/2016 which showed calcifications in her right breast warranting further evaluation. This was followed by a diagnostic right breast mammogram which showed 5 x 3 x 2 mm or calcification in the right breast lower inner quadrant. No associated mass.  2. She had stereotactic biopsy of this lesion which showed invasive mammary carcinomato type. 3 mm, grade 2 ER greater than 90% positive, PR 1-10% positive and HER-2/neu negative   3. Patient has been seen by Dr. Tamala Julian and is scheduled to undergo surgery on 10/11/16  4. Patient has been getting mammograms since the age of 28 and has not had any prior abnormal mammograms. She is G1 P1 L1. She used birth control pills for a long time and then switched to IUD which she has had for 10 years and recently had taken out. She has not had any menstrual cycles since the insertion of IUD.Hormone levels were checked post IUD removal and were in the post menopausal range.Family history significant for breast cancer in her maternal grandmother died in her 89s and pancreatic cancer in her maternal  uncle.  5. DIAGNOSIS:  A. RIGHT BREAST MASS; NEEDLE LOCALIZED EXCISION:  - INVASIVE MAMMARY CARCINOMA OF NO SPECIAL TYPE.  - THE INFERIOR MARGIN RESECTION IS POSITIVE.  - BIOPSY SITE CHANGES, MARKER CLIP PRESENT.  - HEMATOMA.  - SEE SUMMARY BELOW.   B. SENTINEL LYMPH NODE; EXCISION:  - NO TUMOR SEEN IN ONE LYMPH NODE (0/1).   She did have re excision of positive inferior margin which did not reveal any residual tumor.  Size of tumor on final path was less than 5 mm and therefore Oncotype testing was not done and she did not require any adjuvant chemotherapy. She did complete her adjuvant radiation and was started on Arimidex in September 2018.  Patient had side effects with Arimidex and was switched to exemestane orbaseline bone density scan showed osteopenia  Interval history patient is tolerating exemestane well without any significant side effects.   Review of Systems  Constitutional: Negative for chills, fever, malaise/fatigue and weight loss.  HENT: Negative for congestion, ear discharge and nosebleeds.   Eyes: Negative for blurred vision.  Respiratory: Negative for cough, hemoptysis, sputum production, shortness of breath and wheezing.   Cardiovascular: Negative for chest pain, palpitations, orthopnea and claudication.  Gastrointestinal: Negative for abdominal pain, blood in stool, constipation, diarrhea, heartburn, melena, nausea and vomiting.  Genitourinary: Negative for dysuria, flank pain, frequency, hematuria and urgency.  Musculoskeletal: Negative for back pain, joint pain and myalgias.  Skin: Negative for rash.  Neurological: Negative for dizziness, tingling, focal weakness, seizures, weakness and headaches.  Endo/Heme/Allergies: Does not bruise/bleed easily.  Psychiatric/Behavioral: Negative for depression and suicidal ideas.  The patient does not have insomnia.     Allergies  Allergen Reactions  . Meloxicam Rash  . Sulfa Antibiotics Rash    Past Medical  History:  Diagnosis Date  . Anxiety   . Cancer [facility]) 10/2016   Right breast IMC and DCIS  . Complication of anesthesia   . Depression   . Hypothyroidism   . Personal history of radiation therapy 2018   right breast  . PONV (postoperative nausea and vomiting)    AFTER BREAST SURGERY 10-2016    Past Surgical History:  Procedure Laterality Date  . BACK SURGERY     NECK SURGERY-PLATE WITH 4 SCREWS  . BREAST BIOPSY Right 09/19/2016   [facility] amd DCIS  . BREAST LUMPECTOMY Right 10/11/2016   [facility] and DCIS, LN negative Re-excision done 8/3 for clear margins  . CARPAL TUNNEL RELEASE    . CARPAL TUNNEL RELEASE Left 07/01/2019   Procedure: CARPAL TUNNEL RELEASE;  Surgeon: Earnestine Leys, MD;  Location: [facility] ORS;  Service: Orthopedics;  Laterality: Left;  . COLONOSCOPY  2015  . ELBOW SURGERY     X2  . MASTECTOMY, PARTIAL Right 11/04/2016   Procedure: RE-EXCISION INFERIOR MARGIN RIGHT BREAST;  Surgeon: Leonie Green, MD;  Location: [facility] ORS;  Service: General;  Laterality: Right;  . PARTIAL MASTECTOMY WITH NEEDLE LOCALIZATION Right 10/11/2016   Procedure: PARTIAL MASTECTOMY WITH NEEDLE LOCALIZATION;  Surgeon: Leonie Green, MD;  Location: [facility] ORS;  Service: General;  Laterality: Right;  . SENTINEL NODE BIOPSY Right 10/11/2016   Procedure: SENTINEL NODE BIOPSY;  Surgeon: Leonie Green, MD;  Location: [facility] ORS;  Service: General;  Laterality: Right;    Social History   Socioeconomic History  . Marital status: Married    Spouse name: Not on file  . Number of children: Not on file  . Years of education: Not on file  . Highest education level: Not on file  Occupational History  . Not on file  Tobacco Use  . Smoking status: Never Smoker  . Smokeless tobacco: Never Used  Vaping Use  . Vaping Use: Never used  Substance and Sexual Activity  . Alcohol use: Yes    Comment: Social  . Drug use: No  . Sexual activity: Yes  Other Topics Concern  . Not on file  Social History  Narrative  . Not on file   Social Determinants of Health   Financial Resource Strain:   . Difficulty of Paying Living Expenses: Not on file  Food Insecurity:   . Worried About Charity fundraiser in the Last Year: Not on file  . Ran Out of Food in the Last Year: Not on file  Transportation Needs:   . Lack of Transportation (Medical): Not on file  . Lack of Transportation (Non-Medical): Not on file  Physical Activity:   . Days of Exercise per Week: Not on file  . Minutes of Exercise per Session: Not on file  Stress:   . Feeling of Stress : Not on file  Social Connections:   . Frequency of Communication with Friends and Family: Not on file  . Frequency of Social Gatherings with Friends and Family: Not on file  . Attends Religious Services: Not on file  . Active Member of Clubs or Organizations: Not on file  . Attends Archivist Meetings: Not on file  . Marital Status: Not on file  Intimate Partner Violence:   . Fear of Current or Ex-Partner: Not on file  . Emotionally  Abused: Not on file  . Physically Abused: Not on file  . Sexually Abused: Not on file    Family History  Problem Relation Age of Onset  . Breast cancer Maternal Grandmother 84  . Cancer Maternal Grandmother   . Cancer Maternal Uncle   . Cancer Maternal Grandfather      Current Outpatient Medications:  .  exemestane (AROMASIN) 25 MG tablet, TAKE 1 TABLET BY MOUTH  DAILY AFTER BREAKFAST (Patient taking differently: Take 25 mg by mouth daily after breakfast. ), Disp: 90 tablet, Rfl: 3 .  levothyroxine (SYNTHROID, LEVOTHROID) 112 MCG tablet, Take 112 mcg by mouth daily before breakfast. , Disp: , Rfl:  .  predniSONE (DELTASONE) 10 MG tablet, Take 10 mg by mouth daily., Disp: , Rfl:  .  venlafaxine XR (EFFEXOR-XR) 150 MG 24 hr capsule, TAKE 1 CAPSULE BY MOUTH  DAILY WITH BREAKFAST, Disp: 90 capsule, Rfl: 3 .  alendronate (FOSAMAX) 70 MG tablet, Take 1 tablet (70 mg total) by mouth once a week. Take with  a full glass of water on an empty stomach., Disp: 4 tablet, Rfl: 5  No results found.  No images are attached to the encounter.   No flowsheet data found. No flowsheet data found.   Observation/objective: Appears in no acute distress over video visit today.  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  She is currently on exemestane and this is a routine follow-up visit  Clinically patient is doing well with no concerning symptoms On today's visit.  Recent mammogram from July 2021 was unremarkable.  Patient will continue taking exemestane for 5 years.  Osteopenia: Patient had a repeat bone density scan in July 2021 which was compared to her prior bone density in 2018.Scan showed worsening of her bone density score at the AP spine to -2.4% from prior value of -0.1.  Also the left femur neck showed a score of -2.2% as compared to the prior score of -1.9%.  Area probability of a major fracture was 8.6% and hip fracture was 1.1%.  I have discussed all these findings with her and discussed about the possible options: Option one: Stop exemestane and switch to tamoxifen which has a positive effect on bone health but is a little less effective in terms of breast cancer recurrence. Option 2: Continue exemestane with close monitoring of bone density scan and consider adding bisphosphonates at this time which could be oral versus IV.  Discussed possible risk of osteonecrosis of the jaw associated with IV bisphosphonates and the need for dental clearance.  Discussed risks of oral bisphosphonates including all but not limited to reflux.   Option 3: Continue close monitoring of bone density without initiating bisphosphonates at this time   Patient understands and prefers to take the oral bisphosphonates Fosamax 70 mg once a  week.  Follow-up instructions: Fosamax prescription will be sent to her pharmacy.  I will see her back in 6 months for an in person visit and check a CBC with differential CMP and vitamin D levels at that time  I discussed the assessment and treatment plan with the patient. The patient was provided an opportunity to ask questions and all were answered. The patient agreed with the plan and demonstrated an understanding of the instructions.   The patient was advised to call back or seek an in-person evaluation if the  symptoms worsen or if the condition fails to improve as anticipated.   Visit Diagnosis: 1. Encounter for follow-up surveillance of breast cancer   2. Osteopenia of neck of left femur     Dr. Randa Evens, MD, MPH [facility] at [facility] Tel- [phone] 12/03/2019 3:35 PM

## 2019-12-03 NOTE — Progress Notes (Signed)
RN called and verified pt name and date of birth, pt denies any concerns today. Pt verbalizes understanding of my chart video visit today at 230p.

## 2019-12-21 ENCOUNTER — Other Ambulatory Visit: Payer: Self-pay | Admitting: Oncology

## 2020-04-14 ENCOUNTER — Other Ambulatory Visit: Payer: Self-pay | Admitting: Oncology

## 2020-05-19 ENCOUNTER — Telehealth: Payer: Self-pay | Admitting: Oncology

## 2020-05-19 NOTE — Telephone Encounter (Signed)
Forwarded to Dr. Janese Banks team

## 2020-05-19 NOTE — Telephone Encounter (Signed)
Pt would like he labs completed @ [facility]. 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 Send orders to [employer] please. Pt is scheduled for follow-up on 3/1.

## 2020-05-19 NOTE — Telephone Encounter (Signed)
Called the pt and let her know that I have filled out form and let her know that she will need to get labs done 2/25 and will see md on 3/1. Pt wants me to mail it to her and I will put it in the mail today

## 2020-05-21 ENCOUNTER — Ambulatory Visit
Admission: RE | Admit: 2020-05-21 | Discharge: 2020-05-21 | Disposition: A | Discharge status: Home / Self Care | Payer: Managed Care, Other (non HMO) | Source: Ambulatory Visit | Attending: Radiation Oncology | Admitting: Radiation Oncology

## 2020-05-21 ENCOUNTER — Encounter: Payer: Self-pay | Admitting: Radiation Oncology

## 2020-05-21 ENCOUNTER — Other Ambulatory Visit: Payer: Self-pay

## 2020-05-21 VITALS — BP 142/94 | HR 86 | Temp 97.7°F | Resp 18 | Wt 167.3 lb

## 2020-05-21 DIAGNOSIS — Z923 Personal history of irradiation: Secondary | ICD-10-CM | POA: Insufficient documentation

## 2020-05-21 DIAGNOSIS — Z170 Estrogen receptor positive status [ER+]: Secondary | ICD-10-CM | POA: Diagnosis not present

## 2020-05-21 DIAGNOSIS — C50311 Malignant neoplasm of lower-inner quadrant of right female breast: Secondary | ICD-10-CM | POA: Diagnosis present

## 2020-05-21 DIAGNOSIS — Z79811 Long term (current) use of aromatase inhibitors: Secondary | ICD-10-CM | POA: Insufficient documentation

## 2020-05-21 NOTE — Progress Notes (Signed)
Radiation Oncology Follow up Note  Name: Sandy Petty   Date:   05/21/2020 MRN:  [id number] DOB: 1960/08/15    This 60 y.o. female presents to the clinic today for close to 4-year follow-up status post whole breast radiation to her right breast for ER/PR positive stage I invasive mammary carcinoma.  REFERRING PROVIDER: Derinda Late, MD  HPI: Patient is a 60 year old female now out close to 4 years having completed whole breast radiation to her right breast for stage I ER/PR positive invasive mammary carcinoma.  Seen today in routine follow-up she is doing well.  She specifically denies breast tenderness cough or bone pain..  Mammograms from July which I have reviewed were BI-RADS 2 benign.  She is currently on Aromasin tolerating it well without side effect.  COMPLICATIONS OF TREATMENT: none  FOLLOW UP COMPLIANCE: keeps appointments   PHYSICAL EXAM:  BP (!) 142/94 (BP Location: Left Arm, Patient Position: Sitting)   Pulse 86   Temp 97.7 F (36.5 C) (Tympanic)   Resp 18   Wt 167 lb 4.8 oz (75.9 kg)   BMI 27.84 kg/m  Lungs are clear to A&P cardiac examination essentially unremarkable with regular rate and rhythm. No dominant mass or nodularity is noted in either breast in 2 positions examined. Incision is well-healed. No axillary or supraclavicular adenopathy is appreciated. Cosmetic result is excellent.  Well-developed well-nourished patient in NAD. HEENT reveals PERLA, EOMI, discs not visualized.  Oral cavity is clear. No oral mucosal lesions are identified. Neck is clear without evidence of cervical or supraclavicular adenopathy. Lungs are clear to A&P. Cardiac examination is essentially unremarkable with regular rate and rhythm without murmur rub or thrill. Abdomen is benign with no organomegaly or masses noted. Motor sensory and DTR levels are equal and symmetric in the upper and lower extremities. Cranial nerves II through XII are grossly intact. Proprioception is intact. No  peripheral adenopathy or edema is identified. No motor or sensory levels are noted. Crude visual fields are within normal range.  RADIOLOGY RESULTS: Mammograms reviewed compatible with above-stated findings  PLAN: Present time patient is close to 4 years out with no evidence of disease.  I am going to discontinue follow-up care she continues biannual follow-up with Dr. Janese Banks.  Be happy to reevaluate her anytime should further consultation be indicated.  I would like to take this opportunity to thank you for allowing me to participate in the care of your patient.Noreene Filbert, MD

## 2020-05-29 ENCOUNTER — Encounter: Payer: Self-pay | Admitting: Oncology

## 2020-06-02 ENCOUNTER — Other Ambulatory Visit: Payer: Managed Care, Other (non HMO)

## 2020-06-02 ENCOUNTER — Other Ambulatory Visit: Payer: Self-pay

## 2020-06-02 ENCOUNTER — Inpatient Hospital Stay: Payer: Managed Care, Other (non HMO) | Attending: Oncology | Admitting: Oncology

## 2020-06-02 ENCOUNTER — Encounter: Payer: Self-pay | Admitting: Oncology

## 2020-06-02 VITALS — BP 131/89 | HR 88 | Temp 98.3°F | Wt 168.5 lb

## 2020-06-02 DIAGNOSIS — Z170 Estrogen receptor positive status [ER+]: Secondary | ICD-10-CM | POA: Diagnosis not present

## 2020-06-02 DIAGNOSIS — Z08 Encounter for follow-up examination after completed treatment for malignant neoplasm: Secondary | ICD-10-CM

## 2020-06-02 DIAGNOSIS — Z79899 Other long term (current) drug therapy: Secondary | ICD-10-CM | POA: Diagnosis not present

## 2020-06-02 DIAGNOSIS — Z853 Personal history of malignant neoplasm of breast: Secondary | ICD-10-CM | POA: Diagnosis not present

## 2020-06-02 DIAGNOSIS — C50911 Malignant neoplasm of unspecified site of right female breast: Secondary | ICD-10-CM | POA: Diagnosis present

## 2020-06-02 DIAGNOSIS — Z803 Family history of malignant neoplasm of breast: Secondary | ICD-10-CM | POA: Insufficient documentation

## 2020-06-02 DIAGNOSIS — Z809 Family history of malignant neoplasm, unspecified: Secondary | ICD-10-CM | POA: Insufficient documentation

## 2020-06-02 DIAGNOSIS — E039 Hypothyroidism, unspecified: Secondary | ICD-10-CM | POA: Diagnosis not present

## 2020-06-02 DIAGNOSIS — M85852 Other specified disorders of bone density and structure, left thigh: Secondary | ICD-10-CM | POA: Diagnosis not present

## 2020-06-02 DIAGNOSIS — Z923 Personal history of irradiation: Secondary | ICD-10-CM | POA: Diagnosis not present

## 2020-06-02 DIAGNOSIS — Z79811 Long term (current) use of aromatase inhibitors: Secondary | ICD-10-CM | POA: Diagnosis not present

## 2020-06-02 MED ORDER — VENLAFAXINE HCL ER 150 MG PO CP24: 150.0000 mg | ORAL_CAPSULE | Freq: Every day | ORAL | 3 refills | Status: DC

## 2020-06-10 NOTE — Progress Notes (Signed)
Hematology/Oncology Consult note [facility]  [phone] [phone]  Patient Care Team: Derinda Late, MD as PCP - General (Family Medicine)   Name of the patient: Sandy Petty  [id number]  [birth year]   Date of visit: 06/10/20  Diagnosis-  Invasive mammary carcinoma of right breast pathological prognostic stage IA [id number] ER positive, PR positive and her 2 neu negative   Chief complaint/ Reason for visit- routine f/u of breast cancer  Heme/Onc history: 1. Patient is a 60 year old female who recently had a normal bilateral screening mammogram on 09/05/2016 which showed calcifications in her right breast warranting further evaluation. This was followed by a diagnostic right breast mammogram which showed 5 x 3 x 2 mm or calcification in the right breast lower inner quadrant. No associated mass.  2. She had stereotactic biopsy of this lesion which showed invasive mammary carcinomato type. 3 mm, grade 2 ER greater than 90% positive, PR 1-10% positive and HER-2/neu negative   3. Patient has been seen by Dr. Tamala Julian and is scheduled to undergo surgery on 10/11/16  4. Patient has been getting mammograms since the age of 8 and has not had any prior abnormal mammograms. She is G1 P1 L1. She used birth control pills for a long time and then switched to IUD which she has had for 10 years and recently had taken out. She has not had any menstrual cycles since the insertion of IUD.Hormone levels were checked post IUD removal and were in the post menopausal range.Family history significant for breast cancer in her maternal grandmother died in her 36s and pancreatic cancer in her maternal uncle.  5. DIAGNOSIS:  A. RIGHT BREAST MASS; NEEDLE LOCALIZED EXCISION:  - INVASIVE MAMMARY CARCINOMA OF NO SPECIAL TYPE.  - THE INFERIOR MARGIN RESECTION IS POSITIVE.  - BIOPSY SITE CHANGES, MARKER CLIP PRESENT.  - HEMATOMA.  - SEE SUMMARY BELOW.   B.  SENTINEL LYMPH NODE; EXCISION:  - NO TUMOR SEEN IN ONE LYMPH NODE (0/1).   She did have re excision of positive inferior margin which did not reveal any residual tumor.  Size of tumor on final path was less than 5 mm and therefore Oncotype testing was not done and she did not require any adjuvant chemotherapy. She did complete her adjuvant radiation and was started on Arimidex in September 2018.  Patient had side effects with Arimidex and was switched to exemestane orbaseline bone density scan showed osteopenia   Interval history- Patient reports doing well on aromasin. Denies any specific complaints at this time. Appetite and weight have remained stable. Denies any breast concerns  ECOG PS- 0 Pain scale- 0   Review of systems- Review of Systems  Constitutional: Negative for chills, fever, malaise/fatigue and weight loss.  HENT: Negative for congestion, ear discharge and nosebleeds.   Eyes: Negative for blurred vision.  Respiratory: Negative for cough, hemoptysis, sputum production, shortness of breath and wheezing.   Cardiovascular: Negative for chest pain, palpitations, orthopnea and claudication.  Gastrointestinal: Negative for abdominal pain, blood in stool, constipation, diarrhea, heartburn, melena, nausea and vomiting.  Genitourinary: Negative for dysuria, flank pain, frequency, hematuria and urgency.  Musculoskeletal: Negative for back pain, joint pain and myalgias.  Skin: Negative for rash.  Neurological: Negative for dizziness, tingling, focal weakness, seizures, weakness and headaches.  Endo/Heme/Allergies: Does not bruise/bleed easily.  Psychiatric/Behavioral: Negative for depression and suicidal ideas. The patient does not have insomnia.        Allergies  Allergen Reactions  .  Meloxicam Rash  . Sulfa Antibiotics Rash     Past Medical History:  Diagnosis Date  . Anxiety   . Cancer [facility]) 10/2016   Right breast IMC and DCIS  . Complication of anesthesia    . Depression   . Hypothyroidism   . Personal history of radiation therapy 2018   right breast  . PONV (postoperative nausea and vomiting)    AFTER BREAST SURGERY 10-2016     Past Surgical History:  Procedure Laterality Date  . BACK SURGERY     NECK SURGERY-PLATE WITH 4 SCREWS  . BREAST BIOPSY Right 09/19/2016   [facility] amd DCIS  . BREAST LUMPECTOMY Right 10/11/2016   [facility] and DCIS, LN negative Re-excision done 8/3 for clear margins  . CARPAL TUNNEL RELEASE    . CARPAL TUNNEL RELEASE Left 07/01/2019   Procedure: CARPAL TUNNEL RELEASE;  Surgeon: Earnestine Leys, MD;  Location: [facility] ORS;  Service: Orthopedics;  Laterality: Left;  . COLONOSCOPY  2015  . ELBOW SURGERY     X2  . MASTECTOMY, PARTIAL Right 11/04/2016   Procedure: RE-EXCISION INFERIOR MARGIN RIGHT BREAST;  Surgeon: Leonie Green, MD;  Location: [facility] ORS;  Service: General;  Laterality: Right;  . PARTIAL MASTECTOMY WITH NEEDLE LOCALIZATION Right 10/11/2016   Procedure: PARTIAL MASTECTOMY WITH NEEDLE LOCALIZATION;  Surgeon: Leonie Green, MD;  Location: [facility] ORS;  Service: General;  Laterality: Right;  . SENTINEL NODE BIOPSY Right 10/11/2016   Procedure: SENTINEL NODE BIOPSY;  Surgeon: Leonie Green, MD;  Location: [facility] ORS;  Service: General;  Laterality: Right;    Social History   Socioeconomic History  . Marital status: Married    Spouse name: Not on file  . Number of children: Not on file  . Years of education: Not on file  . Highest education level: Not on file  Occupational History  . Not on file  Tobacco Use  . Smoking status: Never Smoker  . Smokeless tobacco: Never Used  Vaping Use  . Vaping Use: Never used  Substance and Sexual Activity  . Alcohol use: Yes    Comment: Social  . Drug use: No  . Sexual activity: Yes  Other Topics Concern  . Not on file  Social History Narrative  . Not on file   Social Determinants of Health   Financial Resource Strain: Not on file  Food Insecurity:  Not on file  Transportation Needs: Not on file  Physical Activity: Not on file  Stress: Not on file  Social Connections: Not on file  Intimate Partner Violence: Not on file    Family History  Problem Relation Age of Onset  . Breast cancer Maternal Grandmother 34  . Cancer Maternal Grandmother   . Cancer Maternal Uncle   . Cancer Maternal Grandfather      Current Outpatient Medications:  .  alendronate (FOSAMAX) 70 MG tablet, TAKE 1 TABLET EVERY 7 DAYS WITH A FULL GLASS OF WATER ON AN EMPTY STOMACH DO NOT LIE DOWN FOR AT LEAST 30 MIN, Disp: 4 tablet, Rfl: 5 .  exemestane (AROMASIN) 25 MG tablet, TAKE 1 TABLET BY MOUTH  DAILY AFTER BREAKFAST, Disp: 90 tablet, Rfl: 3 .  levothyroxine (SYNTHROID, LEVOTHROID) 112 MCG tablet, Take 112 mcg by mouth daily before breakfast. , Disp: , Rfl:  .  venlafaxine XR (EFFEXOR-XR) 150 MG 24 hr capsule, Take 1 capsule (150 mg total) by mouth daily with breakfast., Disp: 90 capsule, Rfl: 3  Physical exam:  Vitals:   06/02/20 1334  BP: 131/89  Pulse: 88  Temp: 98.3 F (36.8 C)  TempSrc: Tympanic  SpO2: 98%  Weight: 168 lb 8 oz (76.4 kg)   Physical Exam Constitutional:      General: She is not in acute distress. Eyes:     Extraocular Movements: EOM normal.     Pupils: Pupils are equal, round, and reactive to light.  Cardiovascular:     Rate and Rhythm: Normal rate.  Pulmonary:     Effort: Pulmonary effort is normal.  Skin:    General: Skin is warm and dry.  Neurological:     Mental Status: She is alert and oriented to person, place, and time.    Breast exam was performed in seated and lying down position. Patient is status post right lumpectomy with a well-healed surgical scar. No evidence of any palpable masses. No evidence of axillary adenopathy. No evidence of any palpable masses or lumps in the left breast. No evidence of leftt axillary adenopathy   Assessment and plan- Patient is a 60 y.o. female with h/o stage 1 right breast cancer  Er positive here for routine f/u  Discussed results of bone density scan which showed left femur neck osteopenia with a score of -2. MOF risk 8.6% andf hip fracture risk 1.1 %. Mild worsening since 2018.   Discussed continuing aromasin at this time . She is also on fosamax. Will schedule mammogram in July 2022.     Visit Diagnosis 1. Encounter for follow-up surveillance of breast cancer   2. Use of exemestane (Aromasin)   3. Osteopenia of neck of left femur      Dr. Randa Evens, MD, MPH [facility] at [facility] [phone] 06/10/2020 11:37 AM

## 2020-06-24 ENCOUNTER — Other Ambulatory Visit: Payer: Self-pay | Admitting: Oncology

## 2020-11-12 ENCOUNTER — Ambulatory Visit
Admission: RE | Admit: 2020-11-12 | Discharge: 2020-11-12 | Disposition: A | Discharge status: Home / Self Care | Payer: Managed Care, Other (non HMO) | Source: Ambulatory Visit | Attending: Oncology | Admitting: Oncology

## 2020-11-12 ENCOUNTER — Other Ambulatory Visit: Payer: Self-pay

## 2020-11-12 DIAGNOSIS — Z08 Encounter for follow-up examination after completed treatment for malignant neoplasm: Secondary | ICD-10-CM | POA: Insufficient documentation

## 2020-11-12 DIAGNOSIS — Z1231 Encounter for screening mammogram for malignant neoplasm of breast: Secondary | ICD-10-CM | POA: Insufficient documentation

## 2020-11-12 DIAGNOSIS — Z853 Personal history of malignant neoplasm of breast: Secondary | ICD-10-CM | POA: Diagnosis present

## 2020-11-12 DIAGNOSIS — Z79811 Long term (current) use of aromatase inhibitors: Secondary | ICD-10-CM | POA: Insufficient documentation

## 2020-12-03 ENCOUNTER — Other Ambulatory Visit: Payer: Self-pay

## 2020-12-03 ENCOUNTER — Encounter: Payer: Self-pay | Admitting: *Deleted

## 2020-12-03 ENCOUNTER — Inpatient Hospital Stay: Payer: Managed Care, Other (non HMO) | Attending: Oncology | Admitting: Oncology

## 2020-12-03 DIAGNOSIS — Z79811 Long term (current) use of aromatase inhibitors: Secondary | ICD-10-CM

## 2020-12-03 DIAGNOSIS — C50919 Malignant neoplasm of unspecified site of unspecified female breast: Secondary | ICD-10-CM | POA: Diagnosis not present

## 2020-12-03 NOTE — Progress Notes (Signed)
Hematology/Oncology Consult note [facility]  [phone] [phone]  Patient Care Team: Derinda Late, MD as PCP - General (Family Medicine)   Name of the patient: Sandy Petty  [id number]  May 28, 1960   Date of visit: 12/03/20  Diagnosis-  Invasive mammary carcinoma of right breast pathological prognostic stage IA [id number] ER positive, PR positive and her 2 neu negative   I connected with Brown Human on 12/03/20 at  2:30 PM EDT by telephone visit and verified that I am speaking with the correct person using two identifiers.   I discussed the limitations, risks, security and privacy concerns of performing an evaluation and management service by telemedicine and the availability of in-person appointments. I also discussed with the patient that there may be a patient responsible charge related to this service. The patient expressed understanding and agreed to proceed.   Other persons participating in the visit and their role in the encounter: NP, Patient    Patient's location: Home  Provider's location: Clinic   Chief complaint/ Reason for visit- routine f/u of breast cancer  Heme/Onc history:   1. Patient is a 60 year old female who recently had a normal bilateral screening mammogram on 09/05/2016 which showed calcifications in her right breast warranting further evaluation. This was followed by a diagnostic right breast mammogram which showed 5 x 3 x 2 mm or calcification in the right breast lower inner quadrant. No associated mass.   2. She had stereotactic biopsy of this lesion which showed invasive mammary carcinomato type. 3 mm, grade 2 ER greater than 90% positive, PR 1-10% positive and HER-2/neu negative    3. Patient has been seen by Dr. Tamala Julian and is scheduled to undergo surgery on 10/11/16   4. Patient has been getting mammograms since the age of 19 and has not had any prior abnormal mammograms. She is G1 P1 L1. She used  birth control pills for a long time and then switched to IUD which she has had for 10 years and recently had taken out. She has not had any menstrual cycles since the insertion of IUD. Hormone levels were checked post IUD removal and were in the post menopausal range. Family history significant for breast cancer in her maternal grandmother died in her 41s and pancreatic cancer in her maternal uncle.   5. DIAGNOSIS:  A.  RIGHT BREAST MASS; NEEDLE LOCALIZED EXCISION:  - INVASIVE MAMMARY CARCINOMA OF NO SPECIAL TYPE.  - THE INFERIOR MARGIN RESECTION IS POSITIVE.  - BIOPSY SITE CHANGES, MARKER CLIP PRESENT.  - HEMATOMA.  - SEE SUMMARY BELOW.   B.  SENTINEL LYMPH NODE; EXCISION:  - NO TUMOR SEEN IN ONE LYMPH NODE (0/1).     She did have re excision of positive inferior margin which did not reveal any residual tumor.    Size of tumor on final path was less than 5 mm and therefore Oncotype testing was not done and she did not require any adjuvant chemotherapy.  She did complete her adjuvant radiation and was started on Arimidex in September 2018.  Patient had side effects with Arimidex and was switched to exemestane or baseline bone density scan showed osteopenia  Interval history-Mrs. Cammack reports allover bone pain especially in her feet and legs.  She thinks this is due to Aromasin.  She feels this is worsening.  Occasionally has to leave her job early due to the inability to stand.  ECOG PS- 0 Pain scale- 0   Review of  systems- Review of Systems  Constitutional:  Positive for malaise/fatigue. Negative for chills, fever and weight loss.  HENT:  Negative for congestion, ear pain and tinnitus.   Eyes: Negative.  Negative for blurred vision and double vision.  Respiratory: Negative.  Negative for cough, sputum production and shortness of breath.   Cardiovascular: Negative.  Negative for chest pain, palpitations and leg swelling.  Gastrointestinal: Negative.  Negative for abdominal pain,  constipation, diarrhea, nausea and vomiting.  Genitourinary:  Negative for dysuria, frequency and urgency.  Musculoskeletal:  Positive for back pain and joint pain. Negative for falls.  Skin: Negative.  Negative for rash.  Neurological: Negative.  Negative for weakness and headaches.  Endo/Heme/Allergies: Negative.  Does not bruise/bleed easily.  Psychiatric/Behavioral: Negative.  Negative for depression. The patient is not nervous/anxious and does not have insomnia.       Allergies  Allergen Reactions   Meloxicam Rash   Sulfa Antibiotics Rash     Past Medical History:  Diagnosis Date   Anxiety    Cancer ([place]) 10/2016   Right breast IMC and DCIS   Complication of anesthesia    Depression    Hypothyroidism    Personal history of radiation therapy 2018   right breast   PONV (postoperative nausea and vomiting)    AFTER BREAST SURGERY 10-2016     Past Surgical History:  Procedure Laterality Date   BACK SURGERY     NECK SURGERY-PLATE WITH 4 SCREWS   BREAST BIOPSY Right 09/19/2016   [facility] amd DCIS   BREAST LUMPECTOMY Right 10/11/2016   [facility] and DCIS, LN negative Re-excision done 8/3 for clear margins   CARPAL TUNNEL RELEASE     CARPAL TUNNEL RELEASE Left 07/01/2019   Procedure: CARPAL TUNNEL RELEASE;  Surgeon: Earnestine Leys, MD;  Location: [facility] ORS;  Service: Orthopedics;  Laterality: Left;   COLONOSCOPY  2015   ELBOW SURGERY     X2   MASTECTOMY, PARTIAL Right 11/04/2016   Procedure: RE-EXCISION INFERIOR MARGIN RIGHT BREAST;  Surgeon: Leonie Green, MD;  Location: [facility] ORS;  Service: General;  Laterality: Right;   PARTIAL MASTECTOMY WITH NEEDLE LOCALIZATION Right 10/11/2016   Procedure: PARTIAL MASTECTOMY WITH NEEDLE LOCALIZATION;  Surgeon: Leonie Green, MD;  Location: [facility] ORS;  Service: General;  Laterality: Right;   SENTINEL NODE BIOPSY Right 10/11/2016   Procedure: SENTINEL NODE BIOPSY;  Surgeon: Leonie Green, MD;  Location: [facility] ORS;  Service: General;   Laterality: Right;    Social History   Socioeconomic History   Marital status: Married    Spouse name: Not on file   Number of children: Not on file   Years of education: Not on file   Highest education level: Not on file  Occupational History   Not on file  Tobacco Use   Smoking status: Never   Smokeless tobacco: Never  Vaping Use   Vaping Use: Never used  Substance and Sexual Activity   Alcohol use: Yes    Comment: Social   Drug use: No   Sexual activity: Yes  Other Topics Concern   Not on file  Social History Narrative   Not on file   Social Determinants of Health   Financial Resource Strain: Not on file  Food Insecurity: Not on file  Transportation Needs: Not on file  Physical Activity: Not on file  Stress: Not on file  Social Connections: Not on file  Intimate Partner Violence: Not on file    Family History  Problem Relation  Age of Onset   Breast cancer Maternal Grandmother 47   Cancer Maternal Grandmother    Cancer Maternal Uncle    Cancer Maternal Grandfather      Current Outpatient Medications:    alendronate (FOSAMAX) 70 MG tablet, TAKE 1 TABLET EVERY 7 DAYS WITH A FULL GLASS OF WATER ON AN EMPTY STOMACH DO NOT LIE DOWN FOR AT LEAST 30 MIN, Disp: 4 tablet, Rfl: 5   exemestane (AROMASIN) 25 MG tablet, TAKE 1 TABLET BY MOUTH  DAILY AFTER BREAKFAST, Disp: 90 tablet, Rfl: 3   levothyroxine (SYNTHROID, LEVOTHROID) 112 MCG tablet, Take 112 mcg by mouth daily before breakfast. , Disp: , Rfl:    venlafaxine XR (EFFEXOR-XR) 150 MG 24 hr capsule, Take 1 capsule (150 mg total) by mouth daily with breakfast., Disp: 90 capsule, Rfl: 3  Physical exam:  There were no vitals filed for this visit.  Physical Exam Neurological:     Mental Status: She is alert and oriented to person, place, and time.   Assessment and plan- Patient is a 60 y.o. female with h/o stage 1 right breast cancer Er positive here for routine f/u  Reports worsening bone pain secondary to  Aromasin.  Pain is mostly in her feet and lower legs.  Occasionally has to leave her job early secondary to discomfort.  Had a mammogram on 11/12/2020 which was BI-RADS Category 2 benign.  Most recent bone scan was from over a year ago which showed a T score of -2.0.  She is taking calcium and vitamin D along with Fosamax weekly.  We discussed stopping Aromasin and potentially switching to a different aromatase inhibitor but patient would like to continue given she has about a year left.  I have asked her to give [place] a call if this worsens or becomes intolerable.  She will return to clinic in 6 months for follow-up with Dr. Janese Banks.  I provided 15 minutes of non face-to-face telephone visit time during this encounter, and > 50% was spent counseling as documented under my assessment & plan.   Visit Diagnosis 1. Use of exemestane (Aromasin)   2. Malignant neoplasm of female breast, unspecified estrogen receptor status, unspecified laterality, unspecified site of breast ([place])    Faythe Casa, NP 12/03/2020 1:47 PM

## 2020-12-15 ENCOUNTER — Other Ambulatory Visit: Payer: Self-pay | Admitting: Oncology

## 2020-12-30 ENCOUNTER — Other Ambulatory Visit: Payer: Self-pay | Admitting: Oncology

## 2021-03-10 ENCOUNTER — Other Ambulatory Visit: Payer: Self-pay

## 2021-03-10 ENCOUNTER — Ambulatory Visit: Payer: Self-pay | Admitting: Podiatry

## 2021-03-10 ENCOUNTER — Ambulatory Visit (INDEPENDENT_AMBULATORY_CARE_PROVIDER_SITE_OTHER): Payer: Managed Care, Other (non HMO)

## 2021-03-10 ENCOUNTER — Ambulatory Visit: Payer: Managed Care, Other (non HMO) | Admitting: Podiatry

## 2021-03-10 DIAGNOSIS — M722 Plantar fascial fibromatosis: Secondary | ICD-10-CM

## 2021-03-10 MED ORDER — IBUPROFEN 800 MG PO TABS: 800.0000 mg | ORAL_TABLET | Freq: Three times a day (TID) | ORAL | 0 refills | Status: AC | PRN

## 2021-03-10 NOTE — Patient Instructions (Signed)

## 2021-03-15 NOTE — Progress Notes (Signed)
  Subjective:  Patient ID: Sandy Petty, female    DOB: 01-09-1961,  MRN: [id number]  Chief Complaint  Patient presents with   Foot Pain       NP  R Heel and arch pain     60 y.o. female presents with the above complaint. History confirmed with patient.  Its been going on for a few months she has no specific injury that she knows of  Objective:  Physical Exam: warm, good capillary refill, no trophic changes or ulcerative lesions, normal DP and PT pulses, and normal sensory exam. Left Foot: normal exam, no swelling, tenderness, instability; ligaments intact, full range of motion of all ankle/foot joints Right Foot: point tenderness over the heel pad and point tenderness of the mid plantar fascia  No images are attached to the encounter.  Radiographs: Multiple views x-ray of the right foot: no fracture, dislocation, swelling or degenerative changes noted Assessment:   1. Plantar fasciitis of right foot      Plan:  Patient was evaluated and treated and all questions answered.  Discussed the etiology and treatment options for plantar fasciitis including stretching, formal physical therapy, supportive shoegears such as a running shoe or sneaker, pre fabricated orthoses, injection therapy, and oral medications. We also discussed the role of surgical treatment of this for patients who do not improve after exhausting non-surgical treatment options.   -XR reviewed with patient -Educated patient on stretching and icing of the affected limb -Injection delivered to the plantar fascia of the right foot. -Rx for ibuprofen 800 milligrams. Educated on use, risks and benefits of the medication  After sterile prep with povidone-iodine solution and alcohol, the right heel was injected with 0.5cc 2% xylocaine plain, 0.5cc 0.5% marcaine plain, 5mg  triamcinolone acetonide, and 2mg  dexamethasone was injected along the medial plantar fascia at the insertion on the plantar calcaneus. The patient  tolerated the procedure well without complication.   Return in about 1 month (around 04/10/2021) for recheck plantar fasciitis.

## 2021-03-31 ENCOUNTER — Ambulatory Visit: Payer: Self-pay | Admitting: Podiatry

## 2021-04-12 ENCOUNTER — Ambulatory Visit: Payer: Managed Care, Other (non HMO) | Admitting: Podiatry

## 2021-05-01 ENCOUNTER — Other Ambulatory Visit: Payer: Self-pay | Admitting: Oncology

## 2021-06-02 ENCOUNTER — Encounter: Payer: Self-pay | Admitting: Oncology

## 2021-06-02 ENCOUNTER — Inpatient Hospital Stay: Payer: Managed Care, Other (non HMO) | Attending: Oncology | Admitting: Oncology

## 2021-06-02 ENCOUNTER — Other Ambulatory Visit: Payer: Self-pay

## 2021-06-02 VITALS — BP 150/90 | HR 79 | Temp 96.7°F | Resp 16 | Ht 65.0 in | Wt 174.0 lb

## 2021-06-02 DIAGNOSIS — Z853 Personal history of malignant neoplasm of breast: Secondary | ICD-10-CM | POA: Diagnosis not present

## 2021-06-02 DIAGNOSIS — C50911 Malignant neoplasm of unspecified site of right female breast: Secondary | ICD-10-CM | POA: Insufficient documentation

## 2021-06-02 DIAGNOSIS — Z79811 Long term (current) use of aromatase inhibitors: Secondary | ICD-10-CM | POA: Diagnosis not present

## 2021-06-02 DIAGNOSIS — Z08 Encounter for follow-up examination after completed treatment for malignant neoplasm: Secondary | ICD-10-CM

## 2021-06-02 DIAGNOSIS — Z170 Estrogen receptor positive status [ER+]: Secondary | ICD-10-CM | POA: Insufficient documentation

## 2021-06-02 NOTE — Progress Notes (Signed)
? ? ? ?Hematology/Oncology Consult note ?[place]  ?[phone] [phone] ? ?Patient Care Team: ?Derinda Late, MD as PCP - General (Family Medicine)  ? ?Name of the patient: Sandy Petty  ?[id number]  ?1961/02/21  ? ?Date of visit: 06/02/21 ? ?Diagnosis- Invasive mammary carcinoma of right breast pathological prognostic stage IA [id number] ER positive, PR positive and her 2 neu negative ? ?Chief complaint/ Reason for visit-routine follow-up of breast cancer ? ?Heme/Onc history: Patient is a 61 year old female diagnosed with stage I right breast cancer invasive mammary carcinoma grade 2 ER greater than 90% positive PR 1 to 10% positive and HER2 negative.  Her final pathology showedTumor that was 4 mm in size.  She therefore did not require adjuvant chemotherapy or Oncotype testing.  She completed adjuvant radiation treatment and started Arimidex in September 2018.  She was switched to exemestane for better tolerance. ? ?Interval history-tolerating Aromasin well without any significant side effects other than chronic joint pains which is self-limited.  She is concerned about her weight gain.  Denies any specific breast concerns ? ?ECOG PS- 0 ?Pain scale- 0 ? ? ?Review of systems- Review of Systems  ?Constitutional:  Negative for chills, fever, malaise/fatigue and weight loss.  ?     Weight gain  ?HENT:  Negative for congestion, ear discharge and nosebleeds.   ?Eyes:  Negative for blurred vision.  ?Respiratory:  Negative for cough, hemoptysis, sputum production, shortness of breath and wheezing.   ?Cardiovascular:  Negative for chest pain, palpitations, orthopnea and claudication.  ?Gastrointestinal:  Negative for abdominal pain, blood in stool, constipation, diarrhea, heartburn, melena, nausea and vomiting.  ?Genitourinary:  Negative for dysuria, flank pain, frequency, hematuria and urgency.  ?Musculoskeletal:  Positive for joint pain. Negative for back pain and myalgias.   ?Skin:  Negative for rash.  ?Neurological:  Negative for dizziness, tingling, focal weakness, seizures, weakness and headaches.  ?Endo/Heme/Allergies:  Does not bruise/bleed easily.  ?Psychiatric/Behavioral:  Negative for depression and suicidal ideas. The patient does not have insomnia.    ? ? ? ?Allergies  ?Allergen Reactions  ? Meloxicam Rash  ? Sulfa Antibiotics Rash  ? ? ? ?Past Medical History:  ?Diagnosis Date  ? Anxiety   ? Cancer ([place]) 10/2016  ? Right breast [facility] and DCIS  ? Complication of anesthesia   ? Depression   ? Hypothyroidism   ? Personal history of radiation therapy 2018  ? right breast  ? PONV (postoperative nausea and vomiting)   ? AFTER BREAST SURGERY 10-2016  ? ? ? ?Past Surgical History:  ?Procedure Laterality Date  ? BACK SURGERY    ? NECK SURGERY-PLATE WITH 4 SCREWS  ? BREAST BIOPSY Right 09/19/2016  ? [facility] amd DCIS  ? BREAST LUMPECTOMY Right 10/11/2016  ? [facility] and DCIS, LN negative Re-excision done 8/3 for clear margins  ? CARPAL TUNNEL RELEASE    ? CARPAL TUNNEL RELEASE Left 07/01/2019  ? Procedure: CARPAL TUNNEL RELEASE;  Surgeon: Earnestine Leys, MD;  Location: [facility] ORS;  Service: Orthopedics;  Laterality: Left;  ? COLONOSCOPY  2015  ? ELBOW SURGERY    ? X2  ? MASTECTOMY, PARTIAL Right 11/04/2016  ? Procedure: RE-EXCISION INFERIOR MARGIN RIGHT BREAST;  Surgeon: Leonie Green, MD;  Location: [facility] ORS;  Service: General;  Laterality: Right;  ? PARTIAL MASTECTOMY WITH NEEDLE LOCALIZATION Right 10/11/2016  ? Procedure: PARTIAL MASTECTOMY WITH NEEDLE LOCALIZATION;  Surgeon: Leonie Green, MD;  Location: [facility] ORS;  Service: General;  Laterality: Right;  ?  SENTINEL NODE BIOPSY Right 10/11/2016  ? Procedure: SENTINEL NODE BIOPSY;  Surgeon: Leonie Green, MD;  Location: [facility] ORS;  Service: General;  Laterality: Right;  ? ? ?Social History  ? ?Socioeconomic History  ? Marital status: Married  ?  Spouse name: Not on file  ? Number of children: Not on file  ? Years of education: Not on  file  ? Highest education level: Not on file  ?Occupational History  ? Not on file  ?Tobacco Use  ? Smoking status: Never  ? Smokeless tobacco: Never  ?Vaping Use  ? Vaping Use: Never used  ?Substance and Sexual Activity  ? Alcohol use: Yes  ?  Comment: Social  ? Drug use: No  ? Sexual activity: Yes  ?Other Topics Concern  ? Not on file  ?Social History Narrative  ? Not on file  ? ?Social Determinants of Health  ? ?Financial Resource Strain: Not on file  ?Food Insecurity: Not on file  ?Transportation Needs: Not on file  ?Physical Activity: Not on file  ?Stress: Not on file  ?Social Connections: Not on file  ?Intimate Partner Violence: Not on file  ? ? ?Family History  ?Problem Relation Age of Onset  ? Breast cancer Maternal Grandmother 73  ? Cancer Maternal Grandmother   ? Cancer Maternal Uncle   ? Cancer Maternal Grandfather   ? ? ? ?Current Outpatient Medications:  ?  alendronate (FOSAMAX) 70 MG tablet, TAKE 1 TABLET EVERY 7 DAYS WITH A FULL GLASS OF WATER ON AN EMPTY STOMACH DO NOT LIE DOWN FOR AT LEAST 30 MIN, Disp: 4 tablet, Rfl: 5 ?  exemestane (AROMASIN) 25 MG tablet, TAKE 1 TABLET BY MOUTH  DAILY AFTER BREAKFAST, Disp: 90 tablet, Rfl: 3 ?  levothyroxine (SYNTHROID, LEVOTHROID) 112 MCG tablet, Take 112 mcg by mouth daily before breakfast. , Disp: , Rfl:  ?  venlafaxine XR (EFFEXOR-XR) 150 MG 24 hr capsule, TAKE 1 CAPSULE BY MOUTH  DAILY WITH BREAKFAST (Patient taking differently: 75 mg.), Disp: 90 capsule, Rfl: 3 ?  WEGOVY 0.25 MG/0.5ML SOAJ, SMARTSIG:0.5 Milliliter(s) SUB-Q Once a Week, Disp: , Rfl:  ? ?Physical exam:  ?Vitals:  ? 06/02/21 1314  ?BP: (!) 150/90  ?Pulse: 79  ?Resp: 16  ?Temp: (!) 96.7 ?F (35.9 ?C)  ?TempSrc: Tympanic  ?SpO2: 100%  ?Weight: 174 lb (78.9 kg)  ?Height: '5\' 5"'  (1.651 m)  ? ?Physical Exam ?Cardiovascular:  ?   Rate and Rhythm: Normal rate and regular rhythm.  ?   Heart sounds: Normal heart sounds.  ?Pulmonary:  ?   Effort: Pulmonary effort is normal.  ?   Breath sounds: Normal  breath sounds.  ?Musculoskeletal:  ?   Cervical back: Normal range of motion.  ?Skin: ?   General: Skin is warm and dry.  ?Neurological:  ?   Mental Status: She is alert and oriented to person, place, and time.  ? Breast exam was performed in seated and lying down position. ?Patient is status post right lumpectomy with a well-healed surgical scar. No evidence of any palpable masses. No evidence of axillary adenopathy. No evidence of any palpable masses or lumps in the left breast. No evidence of leftt axillary adenopathy ? ? ?Assessment and plan- Patient is a 61 y.o. female with history of stage Ia invasive mammary carcinoma of the right breast ER positive PR negative HER2 negative status postlumpectomy adjuvant radiation therapy and currently on Aromasin here for routine follow-up ? ?Patient will be completing 5 years of endocrine  therapy in September 2023 and following that she can stop taking Aromasin.  She will be due for a mammogram in August 2023 which I will schedule.  Clinically patient is doing well with no concerning signs and symptoms of recurrence based on today's exam.In 6 months and following that I will see her on a yearly basis I will see her back ?  ?Visit Diagnosis ?1. Encounter for follow-up surveillance of breast cancer   ?2. Use of exemestane (Aromasin)   ? ? ? ?Dr. Randa Evens, MD, MPH ?[facility] ?[phone] ?06/02/2021 ?1:36 PM ? ? ? ? ? ? ?    ? ? ? ? ? ?

## 2021-06-12 ENCOUNTER — Other Ambulatory Visit: Payer: Self-pay | Admitting: Oncology

## 2021-08-13 ENCOUNTER — Other Ambulatory Visit: Payer: Self-pay | Admitting: Nurse Practitioner

## 2021-08-13 DIAGNOSIS — R12 Heartburn: Secondary | ICD-10-CM

## 2021-08-13 DIAGNOSIS — R110 Nausea: Secondary | ICD-10-CM

## 2021-08-13 DIAGNOSIS — R142 Eructation: Secondary | ICD-10-CM

## 2021-08-23 ENCOUNTER — Ambulatory Visit
Admission: RE | Admit: 2021-08-23 | Discharge: 2021-08-23 | Disposition: A | Discharge status: Home / Self Care | Payer: Managed Care, Other (non HMO) | Source: Ambulatory Visit | Attending: Nurse Practitioner | Admitting: Nurse Practitioner

## 2021-08-23 DIAGNOSIS — R110 Nausea: Secondary | ICD-10-CM | POA: Diagnosis present

## 2021-08-23 DIAGNOSIS — R142 Eructation: Secondary | ICD-10-CM | POA: Insufficient documentation

## 2021-08-23 DIAGNOSIS — R12 Heartburn: Secondary | ICD-10-CM | POA: Diagnosis present

## 2021-09-13 ENCOUNTER — Other Ambulatory Visit: Payer: Self-pay | Admitting: Internal Medicine

## 2021-09-15 LAB — SURGICAL PATHOLOGY

## 2021-11-16 ENCOUNTER — Ambulatory Visit
Admission: RE | Admit: 2021-11-16 | Discharge: 2021-11-16 | Disposition: A | Discharge status: Home / Self Care | Payer: Managed Care, Other (non HMO) | Source: Ambulatory Visit | Attending: Oncology | Admitting: Oncology

## 2021-11-16 DIAGNOSIS — Z79811 Long term (current) use of aromatase inhibitors: Secondary | ICD-10-CM | POA: Diagnosis present

## 2021-11-16 DIAGNOSIS — Z08 Encounter for follow-up examination after completed treatment for malignant neoplasm: Secondary | ICD-10-CM | POA: Insufficient documentation

## 2021-11-16 DIAGNOSIS — Z853 Personal history of malignant neoplasm of breast: Secondary | ICD-10-CM | POA: Insufficient documentation

## 2021-11-16 DIAGNOSIS — Z1231 Encounter for screening mammogram for malignant neoplasm of breast: Secondary | ICD-10-CM | POA: Insufficient documentation

## 2021-11-27 ENCOUNTER — Other Ambulatory Visit: Payer: Self-pay | Admitting: Oncology

## 2021-12-08 ENCOUNTER — Inpatient Hospital Stay: Payer: Managed Care, Other (non HMO) | Attending: Oncology | Admitting: Medical Oncology

## 2021-12-08 ENCOUNTER — Other Ambulatory Visit: Payer: Self-pay

## 2021-12-08 ENCOUNTER — Encounter: Payer: Self-pay | Admitting: Medical Oncology

## 2021-12-08 VITALS — BP 126/76 | HR 82 | Temp 98.5°F | Resp 16 | Ht 65.0 in | Wt 150.0 lb

## 2021-12-08 DIAGNOSIS — Z08 Encounter for follow-up examination after completed treatment for malignant neoplasm: Secondary | ICD-10-CM

## 2021-12-08 DIAGNOSIS — Z170 Estrogen receptor positive status [ER+]: Secondary | ICD-10-CM | POA: Insufficient documentation

## 2021-12-08 DIAGNOSIS — Z5181 Encounter for therapeutic drug level monitoring: Secondary | ICD-10-CM

## 2021-12-08 DIAGNOSIS — M8580 Other specified disorders of bone density and structure, unspecified site: Secondary | ICD-10-CM | POA: Diagnosis not present

## 2021-12-08 DIAGNOSIS — C50911 Malignant neoplasm of unspecified site of right female breast: Secondary | ICD-10-CM | POA: Insufficient documentation

## 2021-12-08 DIAGNOSIS — Z79811 Long term (current) use of aromatase inhibitors: Secondary | ICD-10-CM | POA: Diagnosis not present

## 2021-12-08 DIAGNOSIS — C50919 Malignant neoplasm of unspecified site of unspecified female breast: Secondary | ICD-10-CM

## 2021-12-08 DIAGNOSIS — Z853 Personal history of malignant neoplasm of breast: Secondary | ICD-10-CM

## 2021-12-08 NOTE — Progress Notes (Signed)
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  Her final pathology showedTumor that was 4 mm in size.  She therefore did not require adjuvant chemotherapy or Oncotype testing.  She completed adjuvant radiation treatment and started Arimidex in September 2018.  She was switched to exemestane for better tolerance.  Interval history-   She reports that she is doing well. She is excited and nervous about completing her 5 years of Arimidex therapy. She has tolerated this well overall with some joint pains as her only side effect. She completes self breast exams and has not noticed any breast changes, masses, pain, nipple discharge, night sweats, unintentional weight loss. She reports no burdensome menopausal symptoms, pain with intercourse or lymphedema. No new family history of cancers since 2018 when genetic counseling was last discussed as an option.    ECOG PS- 0 Pain scale- 0   Review of systems- Review of Systems  Constitutional:  Negative for chills, fever, malaise/fatigue and weight loss.       Weight gain  HENT:  Negative for congestion, ear discharge and nosebleeds.   Eyes:  Negative for blurred vision.  Respiratory:  Negative for cough, hemoptysis, sputum production, shortness of breath and wheezing.   Cardiovascular:  Negative for chest pain,  palpitations, orthopnea and claudication.  Gastrointestinal:  Negative for abdominal pain, blood in stool, constipation, diarrhea, heartburn, melena, nausea and vomiting.  Genitourinary:  Negative for dysuria, flank pain, frequency, hematuria and urgency.  Musculoskeletal:  Positive for joint pain. Negative for back pain and myalgias.  Skin:  Negative for rash.  Neurological:  Negative for dizziness, tingling, focal weakness, seizures, weakness and headaches.  Endo/Heme/Allergies:  Does not bruise/bleed easily.  Psychiatric/Behavioral:  Negative for depression and suicidal ideas. The patient does not have insomnia.      Allergies  Allergen Reactions   Meloxicam Rash   Sulfa Antibiotics Rash     Past Medical History:  Diagnosis Date   Anxiety    Cancer ([place]) 10/2016   Right breast IMC and DCIS   Complication of anesthesia    Depression    Hypothyroidism    Personal history of radiation therapy 2018   right breast   PONV (postoperative nausea and vomiting)    AFTER BREAST SURGERY 10-2016    Past Surgical History:  Procedure Laterality Date   BACK SURGERY     NECK SURGERY-PLATE WITH 4 SCREWS   BREAST BIOPSY Right 09/19/2016   [facility] amd DCIS   BREAST LUMPECTOMY Right 10/11/2016   [facility] and DCIS, LN negative Re-excision done 8/3 for clear margins   CARPAL TUNNEL RELEASE     CARPAL TUNNEL RELEASE Left 07/01/2019   Procedure: CARPAL TUNNEL RELEASE;  Surgeon: Earnestine Leys, MD;  Location: [facility] ORS;  Service: Orthopedics;  Laterality: Left;   COLONOSCOPY  2015   ELBOW SURGERY     X2   MASTECTOMY, PARTIAL Right 11/04/2016  Procedure: RE-EXCISION INFERIOR MARGIN RIGHT BREAST;  Surgeon: Leonie Green, MD;  Location: [facility] ORS;  Service: General;  Laterality: Right;   PARTIAL MASTECTOMY WITH NEEDLE LOCALIZATION Right 10/11/2016   Procedure: PARTIAL MASTECTOMY WITH NEEDLE LOCALIZATION;  Surgeon: Leonie Green, MD;  Location: [facility] ORS;  Service: General;  Laterality: Right;    SENTINEL NODE BIOPSY Right 10/11/2016   Procedure: SENTINEL NODE BIOPSY;  Surgeon: Leonie Green, MD;  Location: [facility] ORS;  Service: General;  Laterality: Right;    Social History   Socioeconomic History   Marital status: Married    Spouse name: Not on file   Number of children: Not on file   Years of education: Not on file   Highest education level: Not on file  Occupational History   Not on file  Tobacco Use   Smoking status: Never   Smokeless tobacco: Never  Vaping Use   Vaping Use: Never used  Substance and Sexual Activity   Alcohol use: Yes    Comment: Social   Drug use: No   Sexual activity: Yes  Other Topics Concern   Not on file  Social History Narrative   Not on file   Social Determinants of Health   Financial Resource Strain: Not on file  Food Insecurity: Not on file  Transportation Needs: Not on file  Physical Activity: Not on file  Stress: Not on file  Social Connections: Not on file  Intimate Partner Violence: Not on file    Family History  Problem Relation Age of Onset   Breast cancer Maternal Grandmother 40   Cancer Maternal Grandmother    Cancer Maternal Uncle    Cancer Maternal Grandfather      Current Outpatient Medications:    alendronate (FOSAMAX) 70 MG tablet, TAKE 1 TABLET EVERY 7 DAYS WITH A FULL GLASS OF WATER ON AN EMPTY STOMACH DO NOT LIE DOWN FOR AT LEAST 30 MIN, Disp: 4 tablet, Rfl: 5   exemestane (AROMASIN) 25 MG tablet, TAKE 1 TABLET BY MOUTH  DAILY AFTER BREAKFAST, Disp: 90 tablet, Rfl: 3   levothyroxine (SYNTHROID, LEVOTHROID) 112 MCG tablet, Take 112 mcg by mouth daily before breakfast. , Disp: , Rfl:    venlafaxine XR (EFFEXOR-XR) 150 MG 24 hr capsule, TAKE 1 CAPSULE BY MOUTH  DAILY WITH BREAKFAST (Patient taking differently: Take 37.5 mg by mouth daily.), Disp: 90 capsule, Rfl: 3   WEGOVY 0.25 MG/0.5ML SOAJ, SMARTSIG:0.5 Milliliter(s) SUB-Q Once a Week (Patient not taking: Reported on 12/08/2021), Disp: , Rfl:   Physical  exam:  Vitals:   12/08/21 1312 12/08/21 1315  BP:  126/76  Pulse:  82  Resp:  16  Temp:  98.5 F (36.9 C)  TempSrc:  Oral  SpO2:  100%  Weight: 150 lb (68 kg)   Height: '5\' 5"'  (1.651 m)    Physical Exam Cardiovascular:     Rate and Rhythm: Normal rate and regular rhythm.     Heart sounds: Normal heart sounds.  Pulmonary:     Effort: Pulmonary effort is normal.     Breath sounds: Normal breath sounds.  Musculoskeletal:     Cervical back: Normal range of motion.  Skin:    General: Skin is warm and dry.  Neurological:     Mental Status: She is alert and oriented to person, place, and time.   Breast exam was performed in lying down position. Patient is status post right lumpectomy with a well-healed surgical scar. No evidence of any palpable masses. No  evidence of axillary adenopathy. No evidence of any palpable masses or lumps in the left breast. No evidence of leftt axillary adenopathy   Assessment and plan- Patient is a 61 y.o. female with   Stage Ia invasive mammary carcinoma of the right breast- ER positive PR negative, HER2/neu negative. S/p lumpectomy 10/11/2016 followed by radiation. Given ER positivity, she started aromasin for a plan of at least 5 years of endocrine therapy. We discussed BCI testing vs discontinuation of her Aromasin therapy. She wishes to think on the BCI testing and get insurance coverage information first as we did discuss that this can be a very costly +$4,500 test if not covered at all by insurance. If she decides to not proceed forward with testing she will complete this last rx of Aromisin and not obtain further refills. She will continue yearly mammograms and continue self breast exams. She will RTC in 1 year or sooner should she notice any changes. In terms of survailence we also reviewed that "Evidence suggests that active lifestyle, healthy diet, limited alcohol intake, and achieving and maintaining an ideal body weight (20-25 BMI) may lead to optimal  breast cancer outcomes". NCCN Guidelines.    Bone Health- baseline bone density scan 12/22/2016 with T score -1.9 consistent with osteopenia. Repeat 10/30/19 slightly worse with T-score -2.0 but essentially stable. Overdue for follow up which we discussed and which will be scheduled. Discussed weight bearing exercise, calcium and vitamin D supplementation.  Genetics- Discussed with patient given history of breast cancer. She wishes to be referred at this time.  Health promotion: she will see her PCP for other health screening, vaccinations, etc.   Disposition: Bone density asap Mammogram 11/2022 Rtc in 1 year for Dr. Janese Banks for breast cancer surveillance    Patient will be completing 5 years of endocrine therapy in September 2023 and following that she can stop taking Aromasin.  She will be due for a mammogram in August 2023 which I will schedule.  Clinically patient is doing well with no concerning signs and symptoms of recurrence based on today's exam.In 6 months and following that I will see her on a yearly basis I will see her back   Visit Diagnosis 1. Encounter for follow-up surveillance of breast cancer   2. Encounter for monitoring aromatase inhibitor therapy   3. Osteopenia, unspecified location     Hughie Closs PA-C [place] [facility] 12/08/2021

## 2021-12-08 NOTE — Progress Notes (Signed)
Pt returns for 41mofollow-up. She reports that she is losing hair; otherwise, no new concerns.

## 2022-01-04 ENCOUNTER — Encounter: Payer: Managed Care, Other (non HMO) | Admitting: Licensed Clinical Social Worker

## 2022-01-04 ENCOUNTER — Other Ambulatory Visit: Payer: Managed Care, Other (non HMO)

## 2022-01-11 ENCOUNTER — Inpatient Hospital Stay: Payer: Managed Care, Other (non HMO) | Attending: Oncology

## 2022-01-11 ENCOUNTER — Inpatient Hospital Stay: Payer: Managed Care, Other (non HMO) | Attending: Oncology | Admitting: Licensed Clinical Social Worker

## 2022-01-11 ENCOUNTER — Encounter: Payer: Self-pay | Admitting: Licensed Clinical Social Worker

## 2022-01-11 DIAGNOSIS — C50919 Malignant neoplasm of unspecified site of unspecified female breast: Secondary | ICD-10-CM

## 2022-01-11 DIAGNOSIS — Z803 Family history of malignant neoplasm of breast: Secondary | ICD-10-CM

## 2022-01-11 NOTE — Progress Notes (Signed)
REFERRING PROVIDER: Hughie Closs, PA-C [place],  [place] [address]  PRIMARY PROVIDER:  Derinda Late, MD  PRIMARY REASON FOR VISIT:  1. Malignant neoplasm of female breast, unspecified estrogen receptor status, unspecified laterality, unspecified site of breast ([place])   2. Family history of breast cancer      HISTORY OF PRESENT ILLNESS:   Sandy Petty, a 61 y.o. female, was seen for a [place] cancer genetics consultation at the request of Dr. Chyrl Civatte due to a personal and family history of breast cancer.  Sandy Petty presents to clinic today to discuss the possibility of a hereditary predisposition to cancer, genetic testing, and to further clarify her future cancer risks, as well as potential cancer risks for family members.    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 The treatment plan included adjuvant radiation and antiestrogen therapy. It appears she did have genetic testing before her surgery in 2018 through [facility], although report is not available in her chart. It seems to have just been BRCA1/BRCA2 genes.  RISK FACTORS:  Menarche was at age 30 .  First live birth at age 34.  Ovaries intact: yes.  Hysterectomy: no.  Menopausal status: postmenopausal.  HRT use: 0 years. Colonoscopy: yes; normal. Mammogram within the last year: yes. Number of breast biopsies: 1. Up to date with pelvic exams: yes.  Past Medical History:  Diagnosis Date   Anxiety    Cancer ([place]) 10/2016   Right breast IMC and DCIS   Complication of anesthesia    Depression    Hypothyroidism    Personal history of radiation therapy 2018   right breast   PONV (postoperative nausea and vomiting)    AFTER BREAST SURGERY 10-2016    Past Surgical History:  Procedure Laterality Date   BACK SURGERY     NECK SURGERY-PLATE WITH 4 SCREWS   BREAST BIOPSY Right 09/19/2016   [facility] amd  DCIS   BREAST LUMPECTOMY Right 10/11/2016   [facility] and DCIS, LN negative Re-excision done 8/3 for clear margins   CARPAL TUNNEL RELEASE     CARPAL TUNNEL RELEASE Left 07/01/2019   Procedure: CARPAL TUNNEL RELEASE;  Surgeon: Earnestine Leys, MD;  Location: [facility] ORS;  Service: Orthopedics;  Laterality: Left;   COLONOSCOPY  2015   ELBOW SURGERY     X2   MASTECTOMY, PARTIAL Right 11/04/2016   Procedure: RE-EXCISION INFERIOR MARGIN RIGHT BREAST;  Surgeon: Leonie Green, MD;  Location: [facility] ORS;  Service: General;  Laterality: Right;   PARTIAL MASTECTOMY WITH NEEDLE LOCALIZATION Right 10/11/2016   Procedure: PARTIAL MASTECTOMY WITH NEEDLE LOCALIZATION;  Surgeon: Leonie Green, MD;  Location: [facility] ORS;  Service: General;  Laterality: Right;   SENTINEL NODE BIOPSY Right 10/11/2016   Procedure: SENTINEL NODE BIOPSY;  Surgeon: Leonie Green, MD;  Location: [facility] ORS;  Service: General;  Laterality: Right;    FAMILY HISTORY:  We obtained a detailed, 4-generation family history.  Significant diagnoses are listed below: Family History  Problem Relation Age of Onset   Breast cancer Maternal Grandmother 54   Cancer Maternal Grandmother    Cancer Maternal Uncle    Cancer Maternal Grandfather    Sandy Petty has 1 son, 13. She has 1 brother, 10, who has 2 daughters.   Sandy Petty mother died at 36, no history of cancer. Patient's maternal uncle had pancreatic cancer and passed in  his 60s. Maternal grandmother had breast cancer and passed of it at 53. Grandfather had lung cancer and history of smoking.  Sandy Petty father passed at 16, no cancer history on his side of the family.  Sandy Petty is unaware of previous family history of genetic testing for hereditary cancer risks. There is no reported Ashkenazi Jewish ancestry. There is no known consanguinity.   GENETIC COUNSELING ASSESSMENT: Sandy Petty is a 60 y.o. female with a personal and family history of breast cancer which is somewhat  suggestive of a hereditary cancer syndrome and predisposition to cancer. We, therefore, discussed and recommended the following at today's visit.   DISCUSSION: We discussed that approximately 10% of breast cancer is hereditary. Most cases of hereditary breast cancer are associated with BRCA1/BRCA2 genes, although there are other genes associated with hereditary cancer as well. Cancers and risks are gene specific. We discussed that testing is beneficial for several reasons including knowing about cancer risks, identifying potential screening and risk-reduction options that may be appropriate, and to understand if other family members could be at risk for cancer and allow them to undergo genetic testing.   We reviewed the characteristics, features and inheritance patterns of hereditary cancer syndromes. We also discussed genetic testing, including the appropriate family members to test, the process of testing, insurance coverage and turn-around-time for results. We discussed the implications of a negative, positive and/or variant of uncertain significant result. We recommended Sandy Petty pursue genetic testing for the [employer] CancerNext-Expanded+RNA gene panel.   Based on Ms. Ganser's personal and family history of cancer, she meets medical criteria for genetic testing. Despite that she meets criteria, she may still have an out of pocket cost. We discussed that if her out of pocket cost for testing is over $100, the laboratory will call and confirm whether she wants to proceed with testing.  If the out of pocket cost of testing is less than $100 she will be billed by the genetic testing laboratory.   PLAN: After considering the risks, benefits, and limitations, Sandy Petty provided informed consent to pursue genetic testing and the blood sample was sent to [facility] for analysis of the CancerNext-Expanded+RNA panel. Results should be available within approximately 2-3 weeks' time, at which point they  will be disclosed by telephone to Sandy Petty, as will any additional recommendations warranted by these results. Sandy Petty will receive a summary of her genetic counseling visit and a copy of her results once available. This information will also be available in Epic.   Sandy Petty questions were answered to her satisfaction today. Our contact information was provided should additional questions or concerns arise. Thank you for the referral and allowing [place] to share in the care of your patient.   Faith Rogue, MS, [facility] Genetic Counselor [email or web address] Phone: [phone]  The patient was seen for a total of 30 minutes in face-to-face genetic counseling.  Dr. Grayland Ormond was available for discussion regarding this case.   _______________________________________________________________________ For Office Staff:  Number of people involved in session: 1 Was an Intern/ student involved with case: no

## 2022-01-18 ENCOUNTER — Ambulatory Visit
Admission: RE | Admit: 2022-01-18 | Discharge: 2022-01-18 | Disposition: A | Discharge status: Home / Self Care | Payer: Managed Care, Other (non HMO) | Source: Ambulatory Visit | Attending: Medical Oncology | Admitting: Medical Oncology

## 2022-01-18 DIAGNOSIS — C50919 Malignant neoplasm of unspecified site of unspecified female breast: Secondary | ICD-10-CM | POA: Insufficient documentation

## 2022-01-24 ENCOUNTER — Telehealth: Payer: Self-pay | Admitting: Licensed Clinical Social Worker

## 2022-01-24 ENCOUNTER — Ambulatory Visit: Payer: Self-pay | Admitting: Licensed Clinical Social Worker

## 2022-01-24 ENCOUNTER — Encounter: Payer: Self-pay | Admitting: Licensed Clinical Social Worker

## 2022-01-24 DIAGNOSIS — Z1379 Encounter for other screening for genetic and chromosomal anomalies: Secondary | ICD-10-CM | POA: Insufficient documentation

## 2022-01-24 NOTE — Progress Notes (Signed)
HPI:   Sandy Petty was previously seen in the [place] clinic due to a personal and family history of cancer and concerns regarding a hereditary predisposition to cancer. Please refer to our prior cancer genetics clinic note for more information regarding our discussion, assessment and recommendations, at the time. Sandy Petty recent genetic test results were disclosed to Sandy, as were recommendations warranted by these results. These results and recommendations are discussed in more detail below.  CANCER HISTORY:  Oncology History   No history exists.    FAMILY HISTORY:  We obtained a detailed, 4-generation family history.  Significant diagnoses are listed below: Family History  Problem Relation Age of Onset   Pancreatic cancer Maternal Uncle    Breast cancer Maternal Grandmother 58   Lung cancer Maternal Grandfather    Sandy Petty has 1 son, 67. She has 1 brother, 72, who has 2 daughters.    Sandy Petty mother died at 64, no history of cancer. Patient's maternal uncle had pancreatic cancer and passed in his 7s. Maternal grandmother had breast cancer and passed of it at 14. Grandfather had lung cancer and history of smoking.   Sandy Petty father passed at 25, no cancer history on his side of the family.   Sandy Petty is unaware of previous family history of genetic testing for hereditary cancer risks. There is no reported Ashkenazi Jewish ancestry. There is no known consanguinity.      GENETIC TEST RESULTS:  The [facility] CancerNext-Expanded+RNA Panel found no pathogenic mutations.   The CancerNext-Expanded + RNAinsight gene panel offered by [employer] and includes sequencing and rearrangement analysis for the following 77 genes: IP, ALK, APC*, ATM*, AXIN2, BAP1, BARD1, BLM, BMPR1A, BRCA1*, BRCA2*, BRIP1*, CDC73, CDH1*,CDK4, CDKN1B, CDKN2A, CHEK2*, CTNNA1, DICER1, FANCC, FH, FLCN, GALNT12, KIF1B, LZTR1, MAX, MEN1, MET, MLH1*, MSH2*, MSH3, MSH6*, MUTYH*, NBN, NF1*,  NF2, NTHL1, PALB2*, PHOX2B, PMS2*, POT1, PRKAR1A, PTCH1, PTEN*, RAD51C*, RAD51D*,RB1, RECQL, RET, SDHA, SDHAF2, SDHB, SDHC, SDHD, SMAD4, SMARCA4, SMARCB1, SMARCE1, STK11, SUFU, TMEM127, TP53*,TSC1, TSC2, VHL and XRCC2 (sequencing and deletion/duplication); EGFR, EGLN1, HOXB13, KIT, MITF, PDGFRA, POLD1 and POLE (sequencing only); EPCAM and GREM1 (deletion/duplication only).   The test report has been scanned into EPIC and is located under the Molecular Pathology section of the Results Review tab.  A portion of the result report is included below for reference. Genetic testing reported out on 01/21/2022.     Even though a pathogenic variant was not identified, possible explanations for the cancer in the family may include: There may be no hereditary risk for cancer in the family. The cancers in Sandy Petty and/or Sandy family may be sporadic/familial or due to other genetic and environmental factors. There may be a gene mutation in one of these genes that current testing methods cannot detect but that chance is small. There could be another gene that has not yet been discovered, or that we have not yet tested, that is responsible for the cancer diagnoses in the family.  It is also possible there is a hereditary cause for the cancer in the family that Sandy Petty did not inherit.  Therefore, it is important to remain in touch with cancer genetics in the future so that we can continue to offer Sandy Petty the most up to date genetic testing.   ADDITIONAL GENETIC TESTING:  We discussed with Sandy Petty that Sandy genetic testing was fairly extensive.  If there are additional relevant genes identified to increase cancer risk that can be analyzed  in the future, we would be happy to discuss and coordinate this testing at that time.    CANCER SCREENING RECOMMENDATIONS:  Sandy Petty test result is considered negative (normal).  This means that we have not identified a hereditary cause for Sandy personal and family  history of cancer at this time.   An individual's cancer risk and medical management are not determined by genetic test results alone. Overall cancer risk assessment incorporates additional factors, including personal medical history, family history, and any available genetic information that may result in a personalized plan for cancer prevention and surveillance. Therefore, it is recommended she continue to follow the cancer management and screening guidelines provided by Sandy oncology and primary healthcare provider.  RECOMMENDATIONS FOR FAMILY MEMBERS:   Since she did not inherit a identifiable mutation in a cancer predisposition gene included on this panel, Sandy Petty could not have inherited a known mutation from Sandy in one of these genes. Individuals in this family might be at some increased risk of developing cancer, over the general population risk, due to the family history of cancer.  Individuals in the family should notify their providers of the family history of cancer. We recommend women in this family have a yearly mammogram beginning at age 55, or 9 years younger than the earliest onset of cancer, an annual clinical breast exam, and perform monthly breast self-exams.  Family members should have colonoscopies by at age 47, or earlier, as recommended by their providers. Other members of the family may still carry a pathogenic variant in one of these genes that Sandy Petty did not inherit. Based on the family history, we recommend Sandy maternal cousins (Petty of Sandy uncle with pancreatic cancer) have genetic counseling and testing. Sandy Petty will let us know if we can be of any assistance in coordinating genetic counseling and/or testing for this family member.    FOLLOW-UP:  Lastly, we discussed with Sandy Petty that cancer genetics is a rapidly advancing field and it is possible that new genetic tests will be appropriate for Sandy and/or Sandy family members in the future. We encouraged Sandy  to remain in contact with cancer genetics on an annual basis so we can update Sandy personal and family histories and let Sandy know of advances in cancer genetics that may benefit this family.   Our contact number was provided. Sandy Petty questions were answered to Sandy satisfaction, and she knows she is welcome to call us at anytime with additional questions or concerns.    Faith Rogue, MS, [facility] Genetic Counselor [email or web address] Phone: [phone]

## 2022-01-24 NOTE — Telephone Encounter (Signed)
I contacted [name] to discuss her genetic testing results. No pathogenic variants were identified in the 77 genes analyzed. Detailed clinic note to follow.   The test report has been scanned into EPIC and is located under the Molecular Pathology section of the Results Review tab.  A portion of the result report is included below for reference.      Sandy Rogue, MS, [facility] Genetic Counselor [email or web address] Phone: [phone]

## 2022-01-28 ENCOUNTER — Encounter: Payer: Self-pay | Admitting: Medical Oncology

## 2022-02-16 ENCOUNTER — Other Ambulatory Visit: Payer: Self-pay | Admitting: Nurse Practitioner

## 2022-05-09 ENCOUNTER — Other Ambulatory Visit: Payer: Self-pay | Admitting: Nurse Practitioner

## 2022-05-09 DIAGNOSIS — J383 Other diseases of vocal cords: Secondary | ICD-10-CM | POA: Diagnosis not present

## 2022-07-04 ENCOUNTER — Other Ambulatory Visit: Payer: Self-pay | Admitting: *Deleted

## 2022-07-04 DIAGNOSIS — C50919 Malignant neoplasm of unspecified site of unspecified female breast: Secondary | ICD-10-CM

## 2022-07-11 DIAGNOSIS — Z79899 Other long term (current) drug therapy: Secondary | ICD-10-CM | POA: Diagnosis not present

## 2022-07-21 DIAGNOSIS — Z124 Encounter for screening for malignant neoplasm of cervix: Secondary | ICD-10-CM | POA: Diagnosis not present

## 2022-07-21 DIAGNOSIS — Z01411 Encounter for gynecological examination (general) (routine) with abnormal findings: Secondary | ICD-10-CM | POA: Diagnosis not present

## 2022-07-21 DIAGNOSIS — Z113 Encounter for screening for infections with a predominantly sexual mode of transmission: Secondary | ICD-10-CM | POA: Diagnosis not present

## 2022-07-21 DIAGNOSIS — N8110 Cystocele, unspecified: Secondary | ICD-10-CM | POA: Diagnosis not present

## 2022-07-21 DIAGNOSIS — F32A Depression, unspecified: Secondary | ICD-10-CM | POA: Diagnosis not present

## 2022-07-21 DIAGNOSIS — Z0142 Encounter for cervical smear to confirm findings of recent normal smear following initial abnormal smear: Secondary | ICD-10-CM | POA: Diagnosis not present

## 2022-08-11 DIAGNOSIS — C50311 Malignant neoplasm of lower-inner quadrant of right female breast: Secondary | ICD-10-CM | POA: Diagnosis not present

## 2022-08-11 DIAGNOSIS — E039 Hypothyroidism, unspecified: Secondary | ICD-10-CM | POA: Diagnosis not present

## 2022-08-11 DIAGNOSIS — Z79899 Other long term (current) drug therapy: Secondary | ICD-10-CM | POA: Diagnosis not present

## 2022-08-11 DIAGNOSIS — F324 Major depressive disorder, single episode, in partial remission: Secondary | ICD-10-CM | POA: Diagnosis not present

## 2022-08-11 DIAGNOSIS — F419 Anxiety disorder, unspecified: Secondary | ICD-10-CM | POA: Diagnosis not present

## 2022-11-18 ENCOUNTER — Ambulatory Visit
Admission: RE | Admit: 2022-11-18 | Discharge: 2022-11-18 | Disposition: A | Discharge status: Home / Self Care | Payer: 59 | Source: Ambulatory Visit | Attending: Oncology | Admitting: Oncology

## 2022-11-18 DIAGNOSIS — C50919 Malignant neoplasm of unspecified site of unspecified female breast: Secondary | ICD-10-CM | POA: Insufficient documentation

## 2022-11-18 DIAGNOSIS — Z1231 Encounter for screening mammogram for malignant neoplasm of breast: Secondary | ICD-10-CM | POA: Insufficient documentation

## 2022-12-09 ENCOUNTER — Encounter: Payer: Self-pay | Admitting: Oncology

## 2022-12-09 ENCOUNTER — Inpatient Hospital Stay: Payer: 59 | Attending: Oncology | Admitting: Oncology

## 2022-12-09 VITALS — BP 134/90 | HR 81 | Temp 96.9°F | Resp 18 | Ht 65.0 in | Wt 159.4 lb

## 2022-12-09 DIAGNOSIS — Z7983 Long term (current) use of bisphosphonates: Secondary | ICD-10-CM | POA: Insufficient documentation

## 2022-12-09 DIAGNOSIS — Z08 Encounter for follow-up examination after completed treatment for malignant neoplasm: Secondary | ICD-10-CM

## 2022-12-09 DIAGNOSIS — M85852 Other specified disorders of bone density and structure, left thigh: Secondary | ICD-10-CM | POA: Diagnosis not present

## 2022-12-09 DIAGNOSIS — Z853 Personal history of malignant neoplasm of breast: Secondary | ICD-10-CM | POA: Diagnosis not present

## 2022-12-09 NOTE — Progress Notes (Signed)
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  Her final pathology showedTumor that was 4 mm in size.  She therefore did not require adjuvant chemotherapy or Oncotype testing.  She completed adjuvant radiation treatment and started Arimidex in September 2018.  She was switched to exemestane for better tolerance.  She completed 5 years of endocrine therapy and September 2023.  She has baseline osteopenia and was started on Fosamax in August 2021. She was changed to boniva by her pcp.     Interval history-she is doing well presently and denies any specific complaints at this time  ECOG PS- 0 Pain scale- 0   Review of systems- Review of Systems  Constitutional:  Negative for chills, fever, malaise/fatigue and weight loss.  HENT:  Negative for congestion, ear discharge and nosebleeds.   Eyes:  Negative for blurred vision.  Respiratory:  Negative for cough, hemoptysis, sputum production, shortness of breath and wheezing.   Cardiovascular:  Negative for chest pain, palpitations, orthopnea and claudication.  Gastrointestinal:  Negative for abdominal pain, blood in stool, constipation, diarrhea, heartburn, melena, nausea and vomiting.  Genitourinary:  Negative for dysuria, flank pain, frequency, hematuria and urgency.  Musculoskeletal:  Negative for back pain,  joint pain and myalgias.  Skin:  Negative for rash.  Neurological:  Negative for dizziness, tingling, focal weakness, seizures, weakness and headaches.  Endo/Heme/Allergies:  Does not bruise/bleed easily.  Psychiatric/Behavioral:  Negative for depression and suicidal ideas. The patient does not have insomnia.       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   Complication of anesthesia    Depression    Hypothyroidism    Personal history of radiation therapy 2018   right breast   PONV (postoperative nausea and vomiting)    AFTER BREAST SURGERY 10-2016     Past Surgical History:  Procedure Laterality Date   BACK SURGERY     NECK SURGERY-PLATE WITH 4 SCREWS   BREAST BIOPSY Right 09/19/2016   [facility] amd DCIS   BREAST LUMPECTOMY Right 10/11/2016   [facility] and DCIS, LN negative Re-excision done 8/3 for clear margins   CARPAL TUNNEL RELEASE     CARPAL TUNNEL RELEASE Left 07/01/2019   Procedure: CARPAL TUNNEL RELEASE;  Surgeon: Deeann Saint, MD;  Location: [facility] ORS;  Service: Orthopedics;  Laterality: Left;   COLONOSCOPY  2015   ELBOW SURGERY     X2   MASTECTOMY, PARTIAL Right 11/04/2016   Procedure: RE-EXCISION INFERIOR MARGIN RIGHT BREAST;  Surgeon: Nadeen Landau, MD;  Location: [facility] ORS;  Service: General;  Laterality: Right;   PARTIAL MASTECTOMY WITH NEEDLE LOCALIZATION Right 10/11/2016   Procedure: PARTIAL MASTECTOMY WITH NEEDLE LOCALIZATION;  Surgeon: Nadeen Landau, MD;  Location: [facility] OR[facility];  Service: General;  Laterality: Right;   SENTINEL NODE BIOPSY Right 10/11/2016   Procedure: SENTINEL NODE BIOPSY;  Surgeon: Nadeen Landau, MD;  Location: [facility] ORS;  Service: General;  Laterality: Right;    Social History   Socioeconomic History   Marital status: Married    Spouse name: Not on file   Number of children: Not on file    Years of education: Not on file   Highest education level: Not on file  Occupational History   Not on file  Tobacco Use   Smoking status: Never   Smokeless tobacco: Never  Vaping Use   Vaping status: Never Used  Substance and Sexual Activity   Alcohol use: Yes    Comment: Social   Drug use: No   Sexual activity: Yes  Other Topics Concern   Not on file  Social History Narrative   Not on file   Social Determinants of Health   Financial Resource Strain: Low Risk  (01/14/2022)   Received [facility], [facility]   Overall Financial Resource Strain (CARDIA)    Difficulty of Paying Living Expenses: Not hard at all  Food Insecurity: No Food Insecurity (01/14/2022)   Received from [facility], [facility]   Hunger Vital Sign    Worried About Running Out of Food in the Last Year: Never true    Ran Out of Food in the Last Year: Never true  Transportation Needs: No Transportation Needs (01/14/2022)   Received from [facility], [facility]   [facility] - Transportation    In the past 12 months, has lack of transportation kept you from medical appointments or from getting medications?: No    Lack of Transportation (Non-Medical): No  Physical Activity: Not on file  Stress: Not on file  Social Connections: Socially Integrated (07/13/2021)   Received from [facility], [facility]   Social Connection and Isolation Panel [NHANES]    Frequency of Communication with Friends and Family: Twice a week    Frequency of Social Gatherings with Friends and Family: Twice a week    Attends Religious Services: 1 to 4 times per year    Active Member of [employer] or Organizations: Not on file    Attends Banker Meetings: 1 to 4 times per year    Marital Status: Married  Catering manager Violence: Not At Risk (07/13/2021)   Received from [facility], [facility]   Humiliation,  Afraid, Rape, and Kick questionnaire    Fear of Current or Ex-Partner: No    Emotionally Abused: No    Physically Abused: No    Sexually Abused: No    Family History  Problem Relation Age of Onset   Pancreatic cancer Maternal Uncle    Breast cancer Maternal Grandmother 51   Lung cancer Maternal Grandfather      Current Outpatient Medications:    ibandronate (BONIVA) 150 MG tablet, Take 150 mg by mouth every 30 (thirty) days. Take in the morning with a full glass of water, on an empty stomach, and do not take anything else by mouth or lie down for the next 30 min., Disp: , Rfl:    levothyroxine (SYNTHROID, LEVOTHROID) 112 MCG tablet, Take 112 mcg by mouth daily before breakfast. , Disp: , Rfl:    venlafaxine XR (EFFEXOR-XR) 150 MG 24 hr capsule, TAKE 1 CAPSULE BY MOUTH  DAILY WITH BREAKFAST (Patient taking differently: Take  37.5 mg by mouth daily.), Disp: 90 capsule, Rfl: 3   alendronate (FOSAMAX) 70 MG tablet, TAKE 1 TABLET EVERY 7 DAYS WITH A FULL GLASS OF WATER ON AN EMPTY STOMACH DO NOT LIE DOWN FOR AT LEAST 30 MIN (Patient not taking: Reported on 12/09/2022), Disp: 12 tablet, Rfl: 1  Physical exam:  Vitals:   12/09/22 1420  BP: (!) 134/90  Pulse: 81  Resp: 18  Temp: (!) 96.9 F (36.1 C)  TempSrc: Tympanic  SpO2: 100%  Weight: 159 lb 6.4 oz (72.3 kg)  Height: 5\' 5"  (1.651 m)   Physical Exam Cardiovascular:     Rate and Rhythm: Normal rate and regular rhythm.     Heart sounds: Normal heart sounds.  Pulmonary:     Effort: Pulmonary effort is normal.     Breath sounds: Normal breath sounds.  Skin:    General: Skin is warm and dry.  Neurological:     Mental Status: She is alert and oriented to person, place, and time.    Breast exam was performed in seated and lying down position. Patient is status post right lumpectomy with a well-healed surgical scar. No evidence of any palpable masses. No evidence of axillary adenopathy. No evidence of any palpable masses or lumps in  the left breast. No evidence of leftt axillary adenopathy    No images are attached to the encounter.  MM 3D SCREENING MAMMOGRAM BILATERAL BREAST  Result Date: 11/22/2022 CLINICAL DATA:  Screening. EXAM: DIGITAL SCREENING BILATERAL MAMMOGRAM WITH TOMOSYNTHESIS AND CAD TECHNIQUE: Bilateral screening digital craniocaudal and mediolateral oblique mammograms were obtained. Bilateral screening digital breast tomosynthesis was performed. The images were evaluated with computer-aided detection. COMPARISON:  Previous exam(s). ACR Breast Density Category c: The breasts are heterogeneously dense, which may obscure small masses. FINDINGS: There are no findings suspicious for malignancy. IMPRESSION: No mammographic evidence of malignancy. A result letter of this screening mammogram will be mailed directly to the patient. RECOMMENDATION: Screening mammogram in one year. (Code:SM-B-01Y) BI-RADS CATEGORY  1: Negative. Electronically Signed   By: Edwin Cap M.D.   On: 11/22/2022 08:09     Assessment and plan- Patient is a 62 y.o. female with history of stage Ia invasive mammary carcinoma of the right breast ER positive PR negative HER2 negative status postlumpectomy adjuvant radiation therapy and 5 years of endocrine therapy.  This is a routine follow-up visit  Patient has completed 5 years of endocrine therapy and does not have to take it any longer.  Clinically she is doing well with no concerning signs and symptomsOf recurrence based on today's exam.  Her mammogram from August 2024 was unremarkable.  I will see her back in 6 months no labs.  Patient does have osteopenia was started on Fosamax given the worsening bone density scores while she was on endocrine therapy.  It would be reasonable to continue this for 3 to 5 years before considering treatment holiday and she has just completed 3 years.  We will plan to get a bone density scan next year and decide about Boniva at that time Visit Diagnosis 1.  Encounter for follow-up surveillance of breast cancer   2. Osteopenia of neck of left femur      Dr. Owens Shark, MD, MPH [facility] at [facility] [phone] 12/09/2022 2:51 PM

## 2023-05-29 ENCOUNTER — Encounter: Payer: Self-pay | Admitting: Oncology

## 2023-05-29 ENCOUNTER — Inpatient Hospital Stay: Payer: 59 | Attending: Oncology | Admitting: Oncology

## 2023-05-29 VITALS — BP 137/87 | HR 74 | Temp 96.7°F | Resp 18 | Wt 164.4 lb

## 2023-05-29 DIAGNOSIS — Z853 Personal history of malignant neoplasm of breast: Secondary | ICD-10-CM | POA: Diagnosis present

## 2023-05-29 DIAGNOSIS — M8580 Other specified disorders of bone density and structure, unspecified site: Secondary | ICD-10-CM | POA: Diagnosis not present

## 2023-05-29 DIAGNOSIS — Z08 Encounter for follow-up examination after completed treatment for malignant neoplasm: Secondary | ICD-10-CM | POA: Diagnosis not present

## 2023-05-29 NOTE — Progress Notes (Signed)
 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  Her final pathology showedTumor that was 4 mm in size.  She therefore did not require adjuvant chemotherapy or Oncotype testing.  She completed adjuvant radiation treatment and started Arimidex in September 2018.  She was switched to exemestane for better tolerance.  She completed 5 years of endocrine therapy and September 2023.  She has baseline osteopenia and was started on Fosamax in August 2021. She was changed to boniva by her pcp.     Interval history-occasional pain at the lumpectomy site but is otherwise doing well.  Denies any breast concerns today  ECOG PS- 0 Pain scale- 0   Review of systems- Review of Systems  Constitutional:  Negative for chills, fever, malaise/fatigue and weight loss.  HENT:  Negative for congestion, ear discharge and nosebleeds.   Eyes:  Negative for blurred vision.  Respiratory:  Negative for cough, hemoptysis, sputum production, shortness of breath and wheezing.   Cardiovascular:  Negative for chest pain, palpitations, orthopnea and claudication.  Gastrointestinal:  Negative for abdominal pain, blood in stool, constipation, diarrhea, heartburn, melena, nausea and vomiting.  Genitourinary:  Negative for dysuria, flank pain, frequency, hematuria and urgency.   Musculoskeletal:  Negative for back pain, joint pain and myalgias.  Skin:  Negative for rash.  Neurological:  Negative for dizziness, tingling, focal weakness, seizures, weakness and headaches.  Endo/Heme/Allergies:  Does not bruise/bleed easily.  Psychiatric/Behavioral:  Negative for depression and suicidal ideas. The patient does not have insomnia.       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   Complication of anesthesia    Depression    Hypothyroidism    Personal history of radiation therapy 2018   right breast   PONV (postoperative nausea and vomiting)    AFTER BREAST SURGERY 10-2016     Past Surgical History:  Procedure Laterality Date   BACK SURGERY     NECK SURGERY-PLATE WITH 4 SCREWS   BREAST BIOPSY Right 09/19/2016   [facility] amd DCIS   BREAST LUMPECTOMY Right 10/11/2016   [facility] and DCIS, LN negative Re-excision done 8/3 for clear margins   CARPAL TUNNEL RELEASE     CARPAL TUNNEL RELEASE Left 07/01/2019   Procedure: CARPAL TUNNEL RELEASE;  Surgeon: Deeann Saint, MD;  Location: [facility] ORS;  Service: Orthopedics;  Laterality: Left;   COLONOSCOPY  2015   ELBOW SURGERY     X2   MASTECTOMY, PARTIAL Right 11/04/2016   Procedure: RE-EXCISION INFERIOR MARGIN RIGHT BREAST;  Surgeon: Nadeen Landau, MD;  Location: [facility] ORS;  Service: General;  Laterality: Right;   PARTIAL MASTECTOMY WITH NEEDLE LOCALIZATION Right 10/11/2016   Procedure: PARTIAL MASTECTOMY WITH NEEDLE LOCALIZATION;  Surgeon: Katrinka Blazing,  Elaina Pattee, MD;  Location: [facility] ORS;  Service: General;  Laterality: Right;   SENTINEL NODE BIOPSY Right 10/11/2016   Procedure: SENTINEL NODE BIOPSY;  Surgeon: Nadeen Landau, MD;  Location: [facility] OR[facility];  Service: General;  Laterality: Right;    Social History   Socioeconomic History   Marital status: Married    Spouse name: Not on  file   Number of children: Not on file   Years of education: Not on file   Highest education level: Not on file  Occupational History   Not on file  Tobacco Use   Smoking status: Never   Smokeless tobacco: Never  Vaping Use   Vaping status: Never Used  Substance and Sexual Activity   Alcohol use: Yes    Comment: Social   Drug use: No   Sexual activity: Yes  Other Topics Concern   Not on file  Social History Narrative   Not on file   Social Drivers of Health   Financial Resource Strain: Low Risk  (02/13/2023)   Received from [facility]   Overall Financial Resource Strain (CARDIA)    Difficulty of Paying Living Expenses: Not hard at all  Food Insecurity: No Food Insecurity (02/13/2023)   Received from [facility]   Hunger Vital Sign    Worried About Running Out of Food in the Last Year: Never true    Ran Out of Food in the Last Year: Never true  Transportation Needs: No Transportation Needs (02/13/2023)   [facility]    In the past 12 months, has lack of transportation kept you from medical appointments or from getting medications?: No    Lack of Transportation (Non-Medical): No  Physical Activity: Not on file  Stress: Not on file  Social Connections: Socially Integrated (07/13/2021)   [facility] from [facility], [facility]   Social Connection and Isolation Panel [NHANES]    Frequency of Communication with Friends and Family: Twice a week    Frequency of Social Gatherings with Friends and Family: Twice a week    Attends Religious Services: 1 to 4 times per year    Active Member of [employer] or Organizations: Not on file    Attends Banker Meetings: 1 to 4 times per year    Marital Status: Married  Catering manager Violence: Not At Risk (07/13/2021)   Received from [facility], [facility]   Humiliation, Afraid, Rape, and Kick questionnaire    Fear of Current  or Ex-Partner: No    Emotionally Abused: No    Physically Abused: No    Sexually Abused: No    Family History  Problem Relation Age of Onset   Pancreatic cancer Maternal Uncle    Breast cancer Maternal Grandmother 51   Lung cancer Maternal Grandfather      Current Outpatient Medications:    alendronate (FOSAMAX) 70 MG tablet, TAKE 1 TABLET EVERY 7 DAYS WITH A FULL GLASS OF WATER ON AN EMPTY STOMACH DO NOT LIE DOWN FOR AT LEAST 30 MIN (Patient not taking: Reported on 12/09/2022), Disp: 12 tablet, Rfl: 1   ibandronate (BONIVA) 150 MG tablet, Take 150 mg by mouth every 30 (thirty) days. Take in the morning with a full glass of water, on an empty stomach, and do not take anything else by mouth or lie down for the next 30 min., Disp: , Rfl:    levothyroxine (SYNTHROID, LEVOTHROID) 112 MCG tablet, Take  112 mcg by mouth daily before breakfast. , Disp: , Rfl:    venlafaxine XR (EFFEXOR-XR) 150 MG 24 hr capsule, TAKE 1 CAPSULE BY MOUTH  DAILY WITH BREAKFAST (Patient taking differently: Take 37.5 mg by mouth daily.), Disp: 90 capsule, Rfl: 3   zolpidem (AMBIEN) 5 MG tablet, Take by mouth. As needed, Disp: , Rfl:   Physical exam:  Vitals:   05/29/23 1124  BP: 137/87  Pulse: 74  Resp: 18  Temp: (!) 96.7 F (35.9 C)  TempSrc: Tympanic  SpO2: 100%  Weight: 164 lb 6.4 oz (74.6 kg)   Physical Exam Cardiovascular:     Rate and Rhythm: Normal rate and regular rhythm.     Heart sounds: Normal heart sounds.  Pulmonary:     Effort: Pulmonary effort is normal.     Breath sounds: Normal breath sounds.  Skin:    General: Skin is warm and dry.  Neurological:     Mental Status: She is alert and oriented to person, place, and time.    Breast exam was performed in seated and lying down position. Patient is status post right lumpectomy with a well-healed surgical scar. No evidence of any palpable masses. No evidence of axillary adenopathy. No evidence of any palpable masses or lumps in the left  breast. No evidence of leftt axillary adenopathy       No data to display             No data to display           Assessment and plan- Patient is a 63 y.o. female with history of stage Ia invasive mammary carcinoma of the right breast ER positive PR negative HER2 negative status postlumpectomy adjuvant radiation therapy and 5 years of endocrine therapy.  She is here for a routine follow-up visit  Clinically patient is doing well with no concerning signs and symptoms of recurrence based on today's exam.  She has completed 5 years of endocrine therapy and is now at year 7 from her diagnosis.  I will see her on a yearly basis.  I will schedule her mammogram in August 2025 and bone density in October 2025.  She will stay on Boniva for 1 more year and if her bone density scan is stable we could consider holding off on giving any further Boniva at that time   Visit Diagnosis 1. Encounter for follow-up surveillance of breast cancer      Dr. Owens Shark, MD, MPH [facility] at [facility] [phone] 05/29/2023 3:22 PM

## 2023-09-26 ENCOUNTER — Other Ambulatory Visit: Payer: Self-pay | Admitting: Oncology

## 2023-09-26 DIAGNOSIS — Z08 Encounter for follow-up examination after completed treatment for malignant neoplasm: Secondary | ICD-10-CM

## 2023-09-26 DIAGNOSIS — Z13820 Encounter for screening for osteoporosis: Secondary | ICD-10-CM

## 2023-09-26 DIAGNOSIS — Z780 Asymptomatic menopausal state: Secondary | ICD-10-CM

## 2024-01-16 ENCOUNTER — Other Ambulatory Visit: Payer: Self-pay | Admitting: Medical Genetics

## 2024-01-18 ENCOUNTER — Other Ambulatory Visit
Admission: RE | Admit: 2024-01-18 | Discharge: 2024-01-18 | Disposition: A | Discharge status: Home / Self Care | Payer: Self-pay | Source: Ambulatory Visit | Attending: Medical Genetics | Admitting: Medical Genetics

## 2024-01-22 ENCOUNTER — Ambulatory Visit
Admission: RE | Admit: 2024-01-22 | Discharge: 2024-01-22 | Disposition: A | Discharge status: Home / Self Care | Source: Ambulatory Visit | Attending: Oncology | Admitting: Oncology

## 2024-01-22 DIAGNOSIS — Z13820 Encounter for screening for osteoporosis: Secondary | ICD-10-CM

## 2024-01-22 DIAGNOSIS — Z08 Encounter for follow-up examination after completed treatment for malignant neoplasm: Secondary | ICD-10-CM

## 2024-01-22 DIAGNOSIS — Z780 Asymptomatic menopausal state: Secondary | ICD-10-CM | POA: Insufficient documentation

## 2024-01-22 DIAGNOSIS — Z853 Personal history of malignant neoplasm of breast: Secondary | ICD-10-CM | POA: Insufficient documentation

## 2024-01-23 ENCOUNTER — Encounter

## 2024-01-23 ENCOUNTER — Other Ambulatory Visit

## 2024-01-30 LAB — GENECONNECT MOLECULAR SCREEN: Genetic Analysis Overall Interpretation: NEGATIVE

## 2024-02-15 ENCOUNTER — Other Ambulatory Visit: Payer: Self-pay | Admitting: Family Medicine

## 2024-02-15 DIAGNOSIS — Z8249 Family history of ischemic heart disease and other diseases of the circulatory system: Secondary | ICD-10-CM

## 2024-02-22 ENCOUNTER — Encounter: Payer: Self-pay | Admitting: Family Medicine

## 2024-02-26 ENCOUNTER — Encounter: Payer: Self-pay | Admitting: Family Medicine

## 2024-02-27 ENCOUNTER — Encounter: Payer: Self-pay | Admitting: Family Medicine

## 2024-03-06 ENCOUNTER — Inpatient Hospital Stay
Admission: RE | Admit: 2024-03-06 | Discharge: 2024-03-06 | Disposition: A | Source: Ambulatory Visit | Attending: Family Medicine | Admitting: Family Medicine

## 2024-03-06 DIAGNOSIS — Z8249 Family history of ischemic heart disease and other diseases of the circulatory system: Secondary | ICD-10-CM

## 2024-05-29 ENCOUNTER — Ambulatory Visit: Payer: 59 | Admitting: Oncology
